# Patient Record
Sex: Female | Born: 1975 | Race: White | Hispanic: No | Marital: Single | Smoking: Never smoker
Health system: Southern US, Community
[De-identification: ages and names within clinical notes are randomized; demographics above are authoritative.]

## PROBLEM LIST (undated history)

## (undated) DIAGNOSIS — F419 Anxiety disorder, unspecified: Secondary | ICD-10-CM

## (undated) DIAGNOSIS — K219 Gastro-esophageal reflux disease without esophagitis: Secondary | ICD-10-CM

## (undated) DIAGNOSIS — J40 Bronchitis, not specified as acute or chronic: Secondary | ICD-10-CM

## (undated) DIAGNOSIS — F32A Depression, unspecified: Secondary | ICD-10-CM

## (undated) DIAGNOSIS — O039 Complete or unspecified spontaneous abortion without complication: Secondary | ICD-10-CM

## (undated) DIAGNOSIS — Z9889 Other specified postprocedural states: Secondary | ICD-10-CM

## (undated) DIAGNOSIS — Z8751 Personal history of pre-term labor: Secondary | ICD-10-CM

## (undated) HISTORY — PX: BREAST SURGERY: SHX581

## (undated) HISTORY — DX: Personal history of pre-term labor: Z87.51

## (undated) HISTORY — PX: BREAST ENHANCEMENT SURGERY: SHX7

## (undated) HISTORY — PX: APPENDECTOMY: SHX54

---

## 2009-07-06 ENCOUNTER — Encounter: Payer: Self-pay | Admitting: Family Medicine

## 2009-07-06 ENCOUNTER — Other Ambulatory Visit: Admission: RE | Admit: 2009-07-06 | Discharge: 2009-07-06 | Payer: Self-pay | Admitting: Family Medicine

## 2009-07-06 ENCOUNTER — Ambulatory Visit: Payer: Self-pay | Admitting: Family Medicine

## 2009-07-06 DIAGNOSIS — G44209 Tension-type headache, unspecified, not intractable: Secondary | ICD-10-CM | POA: Insufficient documentation

## 2009-07-11 ENCOUNTER — Encounter (INDEPENDENT_AMBULATORY_CARE_PROVIDER_SITE_OTHER): Payer: Self-pay | Admitting: *Deleted

## 2009-07-22 ENCOUNTER — Emergency Department: Payer: Self-pay | Admitting: Emergency Medicine

## 2011-06-05 ENCOUNTER — Emergency Department: Payer: Self-pay | Admitting: Emergency Medicine

## 2011-12-18 ENCOUNTER — Observation Stay: Payer: Self-pay

## 2012-01-07 ENCOUNTER — Ambulatory Visit: Payer: Self-pay | Admitting: Obstetrics and Gynecology

## 2012-01-07 LAB — CBC WITH DIFFERENTIAL/PLATELET
Basophil %: 0.1 %
Eosinophil %: 0.5 %
HCT: 40 % (ref 35.0–47.0)
HGB: 13.6 g/dL (ref 12.0–16.0)
Lymphocyte #: 1.5 10*3/uL (ref 1.0–3.6)
MCH: 32.4 pg (ref 26.0–34.0)
Monocyte #: 0.6 10*3/uL (ref 0.0–0.7)
Neutrophil #: 7.8 10*3/uL — ABNORMAL HIGH (ref 1.4–6.5)
Platelet: 187 10*3/uL (ref 150–440)
WBC: 10 10*3/uL (ref 3.6–11.0)

## 2012-01-08 ENCOUNTER — Inpatient Hospital Stay: Payer: Self-pay

## 2013-03-17 ENCOUNTER — Ambulatory Visit: Payer: Self-pay | Admitting: Family Medicine

## 2013-03-26 ENCOUNTER — Emergency Department: Payer: Self-pay | Admitting: Emergency Medicine

## 2014-04-12 ENCOUNTER — Emergency Department: Payer: Self-pay | Admitting: Emergency Medicine

## 2014-06-08 ENCOUNTER — Emergency Department: Payer: Self-pay | Admitting: Emergency Medicine

## 2014-06-08 LAB — COMPREHENSIVE METABOLIC PANEL
ALK PHOS: 82 U/L
ANION GAP: 4 — AB (ref 7–16)
Albumin: 3.6 g/dL (ref 3.4–5.0)
BILIRUBIN TOTAL: 0.5 mg/dL (ref 0.2–1.0)
BUN: 9 mg/dL (ref 7–18)
CHLORIDE: 101 mmol/L (ref 98–107)
CREATININE: 0.93 mg/dL (ref 0.60–1.30)
Calcium, Total: 9 mg/dL (ref 8.5–10.1)
Co2: 31 mmol/L (ref 21–32)
EGFR (African American): >60
EGFR (Non-African Amer.): >60
GLUCOSE: 107 mg/dL — AB (ref 65–99)
Osmolality: 271 (ref 275–301)
Potassium: 3.8 mmol/L (ref 3.5–5.1)
SGOT(AST): 17 U/L (ref 15–37)
SGPT (ALT): 24 U/L (ref 12–78)
SODIUM: 136 mmol/L (ref 136–145)
TOTAL PROTEIN: 7.1 g/dL (ref 6.4–8.2)

## 2014-06-08 LAB — CBC WITH DIFFERENTIAL/PLATELET
BASOS PCT: 0.2 %
Basophil #: 0 10*3/uL (ref 0.0–0.1)
EOS ABS: 0.3 10*3/uL (ref 0.0–0.7)
Eosinophil %: 2.8 %
HCT: 41.4 % (ref 35.0–47.0)
HGB: 14.2 g/dL (ref 12.0–16.0)
LYMPHS PCT: 15 %
Lymphocyte #: 1.6 10*3/uL (ref 1.0–3.6)
MCH: 32.1 pg (ref 26.0–34.0)
MCHC: 34.4 g/dL (ref 32.0–36.0)
MCV: 94 fL (ref 80–100)
MONOS PCT: 4.5 %
Monocyte #: 0.5 x10 3/mm (ref 0.2–0.9)
Neutrophil #: 8 10*3/uL — ABNORMAL HIGH (ref 1.4–6.5)
Neutrophil %: 77.5 %
Platelet: 231 10*3/uL (ref 150–440)
RBC: 4.43 10*6/uL (ref 3.80–5.20)
RDW: 13.2 % (ref 11.5–14.5)
WBC: 10.4 10*3/uL (ref 3.6–11.0)

## 2014-06-08 LAB — URINALYSIS, COMPLETE
BACTERIA: NONE SEEN
Bilirubin,UR: NEGATIVE
Blood: NEGATIVE
GLUCOSE, UR: NEGATIVE mg/dL (ref 0–75)
Ketone: NEGATIVE
LEUKOCYTE ESTERASE: NEGATIVE
NITRITE: NEGATIVE
PH: 7 (ref 4.5–8.0)
Protein: NEGATIVE
RBC,UR: NONE SEEN /HPF (ref 0–5)
SPECIFIC GRAVITY: 1.018 (ref 1.003–1.030)
WBC UR: NONE SEEN /HPF (ref 0–5)

## 2014-06-08 LAB — LIPASE, BLOOD: LIPASE: 93 U/L (ref 73–393)

## 2015-04-17 NOTE — Op Note (Signed)
PATIENT NAME:  Kim Mayo MR#:  [id number] DATE OF BIRTH:  [birth year]  DATE OF PROCEDURE:  01/08/2012  PREOPERATIVE DIAGNOSES:  1. Intrauterine pregnancy at 39 weeks, 0 days gestational age.  2. Breech presentation.  3. History of prior cesarean section, desires repeat.   POSTOPERATIVE DIAGNOSES:  1. Intrauterine pregnancy at 39 weeks, 0 days gestational age.  2. Breech presentation.  3. History of prior cesarean section, desires repeat.   PROCEDURES: Repeat low transverse cesarean section via Pfannenstiel skin incision.   SURGEON: Prentice Docker, M.D.   ASSISTANT: Donzetta Matters, M.D.   ESTIMATED BLOOD LOSS: 500 mL. IV FLUIDS: 2000 mL crystalloid.   URINE OUTPUT: 50 mL clear urine at the end of the case.   SPECIMENS: None.   COMPLICATIONS: None.   FINDINGS:  1. Gravid uterus.  2. Normal appearing fallopian tubes and ovaries bilaterally.  3. Viable female infant with weight of 3,970 grams and Apgars of 8 at one minute and 9 at five minutes.   INDICATIONS FOR PROCEDURE: Kim Mayo is a 39 year old, gravida 3, para 2-0-0-2 who has a history of prior cesarean section. Her fetus is also in the breech presentation and this was verified prior to the procedure. She desired a repeat cesarean section and was therefore taken to the Operating Room for abdominal delivery.   PROCEDURE IN DETAIL: After the patient was met in the preoperative area and consents were reviewed and the patient was examined and questions were answered, she was taken to the Operating Room. She was placed under a spinal anesthesia, which was found to be adequate. She was then placed in the dorsal supine position with a leftward tilt. She was prepped and draped in the usual sterile fashion. A Pfannenstiel incision was made and carried down through the various layers until the abdomen was entered without difficulty. The peritoneum was tented up and entered sharply and extended superiorly and inferiorly with  gentle traction. The bladder blade was placed and a bladder flap was created and the bladder retractor was then replaced retracting the bladder out of the operative field. The uterine position was assessed and then a low transverse incision was made using the scalpel. The incision was extended laterally using cranial and caudal tension. The fetal breech was grasped and delivered in the usual fashion without difficulty. The cord was clamped and cut and the infant was handed to the waiting pediatrician. Cord blood was collected. The placenta was then delivered spontaneously intact and with a three-vessel cord. The uterus was then exteriorized and cleared of all clots and debris. The hysterotomy was closed using a running lock stitch of #0 Vicryl. A second layer of the same suture was used to create an imbrocation layer. Hemostasis was obtained with one additional figure-of-eight stitch along the hysterotomy line. The abdomen was then irrigated and the uterus was returned to the abdomen without difficulty. Hemostasis was assured and the peritoneum was closed using #0 Vicryl in a running fashion.   Two 6-inch x 17-gauge needles were introduced just deep to this fascial layer and superficial to the rectus muscle where the needles were then removed and the sheaths were still positioned.  The needles were introduced through the skin approximately 4cm superior to the skin incision and approximately 1-2cm lateral to the midline. The On-Q silver soaker catheters were inserted through the sheath and the catheters were then placed along the rectus muscles for secure location. The sheaths were then removed from the skin and peeled away. Silver soaker catheters  were secured at the skin with Dermabond, Steri-Strips, and Tegaderm. Once the catheters were secured at the skin, attention was turned to the fascia which was closed using a PDS in a running fashion. The skin was closed using 3-0 Monocryl in a subcuticular fashion and  Steri-Strips were used to secure the skin incision.   The On-Q pain pump catheters were primed with 10 mL of 0.5% Marcaine plain and then the On-Q pain pump was then attached and secured to both catheters. The On-Q pain pump delivers only 2 mL/h per catheter for two catheters for a total of 4 mL/h. There is a total of 400 mL of bupivacaine in the On-Q pain pump.   The patient tolerated the procedure well. Sponge, lap, and needle counts were correct x2. She received prophylactic antibiotics in the form of cefazolin 2 grams prior to skin incision within half an hour. For VTE prophylaxis, the patient had SCDs placed  and were operating prior to skin incision.   ____________________________ Will Bonnet, MD sdj:ap D: 01/08/2012 08:47:07 ET T: 01/08/2012 12:01:45 ET JOB#: [id number]  cc: Will Bonnet, MD, <Dictator> Will Bonnet MD ELECTRONICALLY SIGNED 01/09/2012 23:22

## 2015-08-06 ENCOUNTER — Emergency Department: Payer: BLUE CROSS/BLUE SHIELD

## 2015-08-06 ENCOUNTER — Emergency Department
Admission: EM | Admit: 2015-08-06 | Discharge: 2015-08-07 | Disposition: A | Discharge status: Home / Self Care | Payer: BLUE CROSS/BLUE SHIELD | Source: Home / Self Care | Attending: Student | Admitting: Student

## 2015-08-06 ENCOUNTER — Encounter: Payer: Self-pay | Admitting: *Deleted

## 2015-08-06 DIAGNOSIS — K8000 Calculus of gallbladder with acute cholecystitis without obstruction: Secondary | ICD-10-CM | POA: Diagnosis not present

## 2015-08-06 DIAGNOSIS — R52 Pain, unspecified: Secondary | ICD-10-CM

## 2015-08-06 DIAGNOSIS — Z3202 Encounter for pregnancy test, result negative: Secondary | ICD-10-CM | POA: Insufficient documentation

## 2015-08-06 DIAGNOSIS — K8020 Calculus of gallbladder without cholecystitis without obstruction: Secondary | ICD-10-CM | POA: Insufficient documentation

## 2015-08-06 DIAGNOSIS — Z9049 Acquired absence of other specified parts of digestive tract: Secondary | ICD-10-CM | POA: Diagnosis not present

## 2015-08-06 DIAGNOSIS — K821 Hydrops of gallbladder: Secondary | ICD-10-CM | POA: Diagnosis not present

## 2015-08-06 DIAGNOSIS — R197 Diarrhea, unspecified: Secondary | ICD-10-CM | POA: Insufficient documentation

## 2015-08-06 DIAGNOSIS — Z79899 Other long term (current) drug therapy: Secondary | ICD-10-CM | POA: Diagnosis not present

## 2015-08-06 DIAGNOSIS — F419 Anxiety disorder, unspecified: Secondary | ICD-10-CM | POA: Diagnosis not present

## 2015-08-06 DIAGNOSIS — R1011 Right upper quadrant pain: Secondary | ICD-10-CM

## 2015-08-06 DIAGNOSIS — R1110 Vomiting, unspecified: Secondary | ICD-10-CM

## 2015-08-06 HISTORY — DX: Anxiety disorder, unspecified: F41.9

## 2015-08-06 LAB — COMPREHENSIVE METABOLIC PANEL
ALBUMIN: 3.9 g/dL (ref 3.5–5.0)
ALK PHOS: 61 U/L (ref 38–126)
ALT: 23 U/L (ref 14–54)
AST: 33 U/L (ref 15–41)
Anion gap: 10 (ref 5–15)
BUN: 12 mg/dL (ref 6–20)
CO2: 23 mmol/L (ref 22–32)
CREATININE: 1.04 mg/dL — AB (ref 0.44–1.00)
Calcium: 8.3 mg/dL — ABNORMAL LOW (ref 8.9–10.3)
Chloride: 99 mmol/L — ABNORMAL LOW (ref 101–111)
GFR calc Af Amer: >60 mL/min (ref >60)
GLUCOSE: 121 mg/dL — AB (ref 65–99)
POTASSIUM: 3.3 mmol/L — AB (ref 3.5–5.1)
Sodium: 132 mmol/L — ABNORMAL LOW (ref 135–145)
Total Bilirubin: 0.7 mg/dL (ref 0.3–1.2)
Total Protein: 7.5 g/dL (ref 6.5–8.1)

## 2015-08-06 LAB — CBC
HCT: 43.2 % (ref 35.0–47.0)
HEMOGLOBIN: 15.2 g/dL (ref 12.0–16.0)
MCH: 32.4 pg (ref 26.0–34.0)
MCHC: 35.2 g/dL (ref 32.0–36.0)
MCV: 92.1 fL (ref 80.0–100.0)
PLATELETS: 184 10*3/uL (ref 150–440)
RBC: 4.69 MIL/uL (ref 3.80–5.20)
RDW: 13.1 % (ref 11.5–14.5)
WBC: 8 10*3/uL (ref 3.6–11.0)

## 2015-08-06 LAB — LIPASE, BLOOD: Lipase: 14 U/L — ABNORMAL LOW (ref 22–51)

## 2015-08-06 MED ORDER — SODIUM CHLORIDE 0.9 % IV BOLUS (SEPSIS)
1000.0000 mL | Freq: Once | INTRAVENOUS | Status: AC
  Administered 2015-08-06: 1000 mL via INTRAVENOUS

## 2015-08-06 MED ORDER — ONDANSETRON HCL 4 MG/2ML IJ SOLN
4.0000 mg | Freq: Once | INTRAMUSCULAR | Status: AC
  Administered 2015-08-06: 4 mg via INTRAVENOUS
  Filled 2015-08-06: qty 2

## 2015-08-06 MED ORDER — MORPHINE SULFATE 4 MG/ML IJ SOLN
4.0000 mg | Freq: Once | INTRAMUSCULAR | Status: AC
  Administered 2015-08-06: 4 mg via INTRAVENOUS
  Filled 2015-08-06: qty 1

## 2015-08-06 NOTE — ED Notes (Signed)
Patient reports abd pain with nausea, vomiting, diarrhea and fever since Thursday.

## 2015-08-06 NOTE — ED Provider Notes (Signed)
[facility] Emergency Department Provider Note  ____________________________________________  Time seen: Approximately 11:19 PM  I have reviewed the triage vital signs and the nursing notes.   HISTORY  Chief Complaint Abdominal Pain; Nausea; Emesis; and Fever    HPI Kim Mayo is a 39 y.o. female with no chronic medical problems presents for evaluation of gradual onset constant right upper quadrant and epigastric pain, nonbloody nonbilious emesis, nonbloody diarrhea for the past 2 days. No Modifying factors. Currently her symptoms are severe. She describes the pain as cramping and aching. No dysuria. She has had fever today. No abnormal vaginal bleeding or vaginal discharge. She has a son who recently had a GI illness with vomiting and diarrhea.   Past Medical History  Diagnosis Date  . Anxiety     Patient Active Problem List   Diagnosis Date Noted  . HEADACHE, TENSION 07/06/2009    Past Surgical History  Procedure Laterality Date  . Breast surgery    . Appendectomy      Current Outpatient Rx  Name  Route  Sig  Dispense  Refill  . ALPRAZolam (XANAX) 0.5 MG tablet   Oral   Take 1 tablet by mouth 3 (three) times daily as needed.           Allergies Cephalexin; Codeine; and Oxycodone-acetaminophen  History reviewed. No pertinent family history.  Social History Social History  Substance Use Topics  . Smoking status: Never Smoker   . Smokeless tobacco: None  . Alcohol Use: Yes     Comment: occasionally    Review of Systems Constitutional: + fever, no chills Eyes: No visual changes. ENT: No sore throat. Cardiovascular: Denies chest pain. Respiratory: Denies shortness of breath. Gastrointestinal: + abdominal pain.  + nausea, + vomiting.  + diarrhea.  No constipation. Genitourinary: Negative for dysuria. Musculoskeletal: Negative for back pain. Skin: Negative for rash. Neurological: Negative for headaches, focal weakness or  numbness.  10-point ROS otherwise negative.  ____________________________________________   PHYSICAL EXAM:  VITAL SIGNS: ED Triage Vitals  Enc Vitals Group     BP 08/06/15 2233 122/74 mmHg     Pulse Rate 08/06/15 2233 115     Resp 08/06/15 2233 22     Temp 08/06/15 2233 99.4 F (37.4 C)     Temp Source 08/06/15 2233 Oral     SpO2 08/06/15 2233 95 %     Weight 08/06/15 2233 217 lb (98.431 kg)     Height 08/06/15 2233  (1.651 m)     Head Cir --      Peak Flow --      Pain Score 08/06/15 2233 10     Pain Loc --      Pain Edu? --      Excl. in GC? --     Constitutional: Alert and oriented. Nontoxic appearing and in no acute distress. Eyes: Conjunctivae are normal. PERRL. EOMI. Head: Atraumatic. Nose: No congestion/rhinnorhea. Mouth/Throat: Mucous membranes are dry.  Oropharynx non-erythematous. Neck: No stridor.  Cardiovascular: Alley tachycardic rate, regular rhythm. Grossly normal heart sounds.  Good peripheral circulation. Respiratory: Normal respiratory effort.  No retractions. Lungs CTAB. Gastrointestinal: Normal bowel sounds, soft with moderate tenderness to palpation in the right upper quadrant and the epigastrium. Genitourinary: Deferred Musculoskeletal: No lower extremity tenderness nor edema.  No joint effusions. Neurologic:  Normal speech and language. No gross focal neurologic deficits are appreciated. No gait instability. Skin:  Skin is warm, dry and intact. No rash noted. Psychiatric: Mood  and affect are normal. Speech and behavior are normal.  ____________________________________________   LABS (all labs ordered are listed, but only abnormal results are displayed)  Labs Reviewed  LIPASE, BLOOD - Abnormal; Notable for the following:    Lipase 14 (*)    All other components within normal limits  COMPREHENSIVE METABOLIC PANEL - Abnormal; Notable for the following:    Sodium 132 (*)    Potassium 3.3 (*)    Chloride 99 (*)    Glucose, Bld 121 (*)     Creatinine, Ser 1.04 (*)    Calcium 8.3 (*)    All other components within normal limits  URINALYSIS COMPLETEWITH MICROSCOPIC ([facility] ONLY) - Abnormal; Notable for the following:    Color, Urine YELLOW (*)    APPearance HAZY (*)    Ketones, ur TRACE (*)    Protein, ur 30 (*)    Bacteria, UA RARE (*)    Squamous Epithelial / LPF 0-5 (*)    All other components within normal limits  CBC  POC URINE PREG, ED  POCT PREGNANCY, URINE   ____________________________________________  EKG  none ____________________________________________  RADIOLOGY  RUQ ultrasound IMPRESSION: Shadowing gallstones at the neck of the gallbladder without other signs of cholecystitis.  Normal sonographic appearance of the liver and common bile duct.  ____________________________________________   PROCEDURES  Procedure(s) performed: None  Critical Care performed: No  ____________________________________________   INITIAL IMPRESSION / ASSESSMENT AND PLAN / ED COURSE  Pertinent labs & imaging results that were available during my care of the patient were reviewed by me and considered in my medical decision making (see chart for details).  Kim Mayo is a 39 y.o. female with no chronic medical problems presents for evaluation of gradual onset constant right upper quadrant and epigastric pain, nonbloody nonbilious emesis, nonbloody diarrhea for the past 2 days. On exam, she is nontoxic appearing. She has tenderness to palpation in the epigastrium and the right upper quadrant. She is mildly tachycardic. In screening labs, urinalysis, urine pregnancy as well as right upper quadrant ultrasound to evaluate for any acute gallbladder pathology that her symptoms may be related to viral illness. We'll provide pain control, IV fluids, antiemetics.  ----------------------------------------- 2:09 AM on 08/07/2015 ----------------------------------------- Normal lipase. Labs notable for very mild  hyponatremia, very mild creatinine elevation at 1.04. IV fluids have been given. Urine pregnancy test is negative. Urinalysis is not consistent with infection, normal CBC. Right upper quadrant ultrasound shows gallstones at the gallbladder neck without any other evidence to suggest cholecystitis. Her pain has improved at this time, she is tolerating by mouth intake. Tachycardia resolved. I offered her  oxycodone by mouth before she leaves tonight however she has declined. She reports all pain medications have in the past made her nauseated. I discussed with her that she will take an ODT Zofran 15-20 minutes before taking her pain medications and this should be helpful with her nausea. We discussed immediate return precautions, and need for outpatient surgical follow-up and she is comfortable with the discharge plan.  ____________________________________________   FINAL CLINICAL IMPRESSION(S) / ED DIAGNOSES  Final diagnoses:  Pain  Right upper quadrant pain  Vomiting and diarrhea  Calculus of gallbladder without cholecystitis without obstruction      Gayla Doss, MD 08/07/15 [phone]

## 2015-08-07 ENCOUNTER — Observation Stay: Payer: BLUE CROSS/BLUE SHIELD

## 2015-08-07 ENCOUNTER — Encounter: Payer: Self-pay | Admitting: Emergency Medicine

## 2015-08-07 ENCOUNTER — Observation Stay: Payer: BLUE CROSS/BLUE SHIELD | Admitting: Anesthesiology

## 2015-08-07 ENCOUNTER — Observation Stay
Admission: EM | Admit: 2015-08-07 | Discharge: 2015-08-09 | Disposition: A | Discharge status: Home / Self Care | Payer: BLUE CROSS/BLUE SHIELD | Attending: Surgery | Admitting: Surgery

## 2015-08-07 ENCOUNTER — Encounter
Admission: EM | Disposition: A | Discharge status: Home / Self Care | Payer: Self-pay | Source: Home / Self Care | Attending: Emergency Medicine

## 2015-08-07 DIAGNOSIS — K8000 Calculus of gallbladder with acute cholecystitis without obstruction: Principal | ICD-10-CM | POA: Insufficient documentation

## 2015-08-07 DIAGNOSIS — K8020 Calculus of gallbladder without cholecystitis without obstruction: Secondary | ICD-10-CM | POA: Insufficient documentation

## 2015-08-07 DIAGNOSIS — K8012 Calculus of gallbladder with acute and chronic cholecystitis without obstruction: Secondary | ICD-10-CM

## 2015-08-07 DIAGNOSIS — F419 Anxiety disorder, unspecified: Secondary | ICD-10-CM | POA: Insufficient documentation

## 2015-08-07 DIAGNOSIS — K819 Cholecystitis, unspecified: Secondary | ICD-10-CM | POA: Diagnosis present

## 2015-08-07 DIAGNOSIS — Z9049 Acquired absence of other specified parts of digestive tract: Secondary | ICD-10-CM | POA: Insufficient documentation

## 2015-08-07 DIAGNOSIS — N6482 Hypoplasia of breast: Secondary | ICD-10-CM | POA: Insufficient documentation

## 2015-08-07 DIAGNOSIS — K821 Hydrops of gallbladder: Secondary | ICD-10-CM | POA: Insufficient documentation

## 2015-08-07 DIAGNOSIS — Z79899 Other long term (current) drug therapy: Secondary | ICD-10-CM | POA: Insufficient documentation

## 2015-08-07 HISTORY — PX: CHOLECYSTECTOMY: SHX55

## 2015-08-07 LAB — URINALYSIS COMPLETE WITH MICROSCOPIC (ARMC ONLY)
Bilirubin Urine: NEGATIVE
GLUCOSE, UA: NEGATIVE mg/dL
HGB URINE DIPSTICK: NEGATIVE
LEUKOCYTES UA: NEGATIVE
NITRITE: NEGATIVE
PH: 5 (ref 5.0–8.0)
Protein, ur: 30 mg/dL — AB
Specific Gravity, Urine: 1.023 (ref 1.005–1.030)

## 2015-08-07 LAB — POCT PREGNANCY, URINE: PREG TEST UR: NEGATIVE

## 2015-08-07 SURGERY — LAPAROSCOPIC CHOLECYSTECTOMY WITH INTRAOPERATIVE CHOLANGIOGRAM
Anesthesia: General

## 2015-08-07 MED ORDER — SODIUM CHLORIDE 0.9 % IV SOLN
Freq: Once | INTRAVENOUS | Status: AC
  Administered 2015-08-07: 10:00:00 via INTRAVENOUS

## 2015-08-07 MED ORDER — FENTANYL CITRATE (PF) 100 MCG/2ML IJ SOLN
25.0000 ug | INTRAMUSCULAR | Status: DC | PRN
  Administered 2015-08-07 (×2): 25 ug via INTRAVENOUS

## 2015-08-07 MED ORDER — OXYCODONE HCL 5 MG PO TABS: 5.0000 mg | ORAL_TABLET | ORAL | Status: DC | PRN

## 2015-08-07 MED ORDER — ONDANSETRON HCL 4 MG/2ML IJ SOLN
INTRAMUSCULAR | Status: DC | PRN
  Administered 2015-08-07: 4 mg via INTRAVENOUS

## 2015-08-07 MED ORDER — HYDROMORPHONE HCL 1 MG/ML IJ SOLN
1.0000 mg | INTRAMUSCULAR | Status: DC | PRN
  Administered 2015-08-07 – 2015-08-08 (×6): 1 mg via INTRAVENOUS
  Filled 2015-08-07 (×6): qty 1

## 2015-08-07 MED ORDER — SODIUM CHLORIDE 0.9 % IV SOLN
INTRAVENOUS | Status: DC | PRN
  Administered 2015-08-07: 10 mL

## 2015-08-07 MED ORDER — PROMETHAZINE HCL 25 MG/ML IJ SOLN
INTRAMUSCULAR | Status: AC
  Filled 2015-08-07: qty 1

## 2015-08-07 MED ORDER — PROMETHAZINE HCL 25 MG/ML IJ SOLN
6.2500 mg | INTRAMUSCULAR | Status: DC | PRN
  Administered 2015-08-07: 6.25 mg via INTRAVENOUS

## 2015-08-07 MED ORDER — ACETAMINOPHEN 650 MG RE SUPP
650.0000 mg | Freq: Four times a day (QID) | RECTAL | Status: DC | PRN
Start: 2015-08-07 — End: 2015-08-09

## 2015-08-07 MED ORDER — SODIUM CHLORIDE 0.9 % IV SOLN
INTRAVENOUS | Status: DC | PRN
  Administered 2015-08-07: 1000 mL via INTRAMUSCULAR

## 2015-08-07 MED ORDER — CEFAZOLIN SODIUM-DEXTROSE 2-3 GM-% IV SOLR: 2.0000 g | INTRAVENOUS | Status: DC

## 2015-08-07 MED ORDER — LACTATED RINGERS IV SOLN
INTRAVENOUS | Status: DC | PRN
  Administered 2015-08-07 (×2): via INTRAVENOUS

## 2015-08-07 MED ORDER — NEOSTIGMINE METHYLSULFATE 10 MG/10ML IV SOLN
INTRAVENOUS | Status: DC | PRN
  Administered 2015-08-07: 4 mg via INTRAVENOUS

## 2015-08-07 MED ORDER — ONDANSETRON 4 MG PO TBDP: 4.0000 mg | ORAL_TABLET | Freq: Four times a day (QID) | ORAL | Status: DC | PRN

## 2015-08-07 MED ORDER — SODIUM CHLORIDE 0.9 % IJ SOLN
INTRAMUSCULAR | Status: AC
  Filled 2015-08-07: qty 10

## 2015-08-07 MED ORDER — ONDANSETRON HCL 4 MG/2ML IJ SOLN
4.0000 mg | Freq: Four times a day (QID) | INTRAMUSCULAR | Status: DC | PRN
  Administered 2015-08-07 – 2015-08-09 (×3): 4 mg via INTRAVENOUS
  Filled 2015-08-07 (×3): qty 2

## 2015-08-07 MED ORDER — FAMOTIDINE IN NACL 20-0.9 MG/50ML-% IV SOLN
20.0000 mg | Freq: Once | INTRAVENOUS | Status: AC
  Administered 2015-08-07: 20 mg via INTRAVENOUS
  Filled 2015-08-07: qty 50

## 2015-08-07 MED ORDER — HEPARIN SODIUM (PORCINE) 5000 UNIT/ML IJ SOLN
INTRAMUSCULAR | Status: AC
  Filled 2015-08-07: qty 1

## 2015-08-07 MED ORDER — MORPHINE SULFATE 4 MG/ML IJ SOLN: 4.0000 mg | Freq: Once | INTRAMUSCULAR | Status: DC

## 2015-08-07 MED ORDER — BUPIVACAINE HCL (PF) 0.25 % IJ SOLN
INTRAMUSCULAR | Status: AC
  Filled 2015-08-07: qty 30

## 2015-08-07 MED ORDER — MORPHINE SULFATE 4 MG/ML IJ SOLN
4.0000 mg | Freq: Once | INTRAMUSCULAR | Status: AC
  Administered 2015-08-07: 4 mg via INTRAVENOUS
  Filled 2015-08-07: qty 1

## 2015-08-07 MED ORDER — ENOXAPARIN SODIUM 40 MG/0.4ML ~~LOC~~ SOLN
40.0000 mg | SUBCUTANEOUS | Status: DC
Start: 2015-08-07 — End: 2015-08-09
  Administered 2015-08-07 – 2015-08-09 (×3): 40 mg via SUBCUTANEOUS
  Filled 2015-08-07 (×3): qty 0.4

## 2015-08-07 MED ORDER — KCL IN DEXTROSE-NACL 40-5-0.45 MEQ/L-%-% IV SOLN
INTRAVENOUS | Status: DC
  Administered 2015-08-07 – 2015-08-09 (×4): via INTRAVENOUS
  Filled 2015-08-07 (×6): qty 1000

## 2015-08-07 MED ORDER — ROCURONIUM BROMIDE 100 MG/10ML IV SOLN
INTRAVENOUS | Status: DC | PRN
  Administered 2015-08-07: 15 mg via INTRAVENOUS
  Administered 2015-08-07: 10 mg via INTRAVENOUS
  Administered 2015-08-07: 5 mg via INTRAVENOUS

## 2015-08-07 MED ORDER — KETOROLAC TROMETHAMINE 30 MG/ML IJ SOLN
INTRAMUSCULAR | Status: DC | PRN
  Administered 2015-08-07: 30 mg via INTRAVENOUS

## 2015-08-07 MED ORDER — FENTANYL CITRATE (PF) 100 MCG/2ML IJ SOLN
INTRAMUSCULAR | Status: AC
  Filled 2015-08-07: qty 2

## 2015-08-07 MED ORDER — BUPIVACAINE HCL (PF) 0.25 % IJ SOLN
INTRAMUSCULAR | Status: DC | PRN
  Administered 2015-08-07: 30 mL

## 2015-08-07 MED ORDER — ONDANSETRON HCL 4 MG/2ML IJ SOLN
4.0000 mg | Freq: Once | INTRAMUSCULAR | Status: AC
  Administered 2015-08-07: 4 mg via INTRAVENOUS
  Filled 2015-08-07: qty 2

## 2015-08-07 MED ORDER — OXYCODONE HCL 5 MG PO TABS: 5.0000 mg | ORAL_TABLET | Freq: Four times a day (QID) | ORAL | Status: DC | PRN

## 2015-08-07 MED ORDER — ACETAMINOPHEN 325 MG PO TABS: 650.0000 mg | ORAL_TABLET | Freq: Four times a day (QID) | ORAL | Status: DC | PRN

## 2015-08-07 MED ORDER — LIDOCAINE HCL (CARDIAC) 20 MG/ML IV SOLN
INTRAVENOUS | Status: DC | PRN
  Administered 2015-08-07: 50 mg via INTRAVENOUS

## 2015-08-07 MED ORDER — MIDAZOLAM HCL 2 MG/2ML IJ SOLN
INTRAMUSCULAR | Status: DC | PRN
  Administered 2015-08-07: 3 mg via INTRAVENOUS

## 2015-08-07 MED ORDER — GLYCOPYRROLATE 0.2 MG/ML IJ SOLN
INTRAMUSCULAR | Status: DC | PRN
  Administered 2015-08-07: 0.4 mg via INTRAVENOUS

## 2015-08-07 MED ORDER — ALPRAZOLAM 0.5 MG PO TABS: 0.5000 mg | ORAL_TABLET | Freq: Three times a day (TID) | ORAL | Status: DC | PRN

## 2015-08-07 MED ORDER — FENTANYL CITRATE (PF) 100 MCG/2ML IJ SOLN
INTRAMUSCULAR | Status: DC | PRN
  Administered 2015-08-07 (×2): 100 ug via INTRAVENOUS
  Administered 2015-08-07: 50 ug via INTRAVENOUS

## 2015-08-07 MED ORDER — DEXAMETHASONE SODIUM PHOSPHATE 4 MG/ML IJ SOLN
INTRAMUSCULAR | Status: DC | PRN
  Administered 2015-08-07: 10 mg via INTRAVENOUS

## 2015-08-07 MED ORDER — SUCCINYLCHOLINE CHLORIDE 20 MG/ML IJ SOLN
INTRAMUSCULAR | Status: DC | PRN
  Administered 2015-08-07: 100 mg via INTRAVENOUS

## 2015-08-07 MED ORDER — ONDANSETRON 4 MG PO TBDP: 4.0000 mg | ORAL_TABLET | Freq: Three times a day (TID) | ORAL | Status: DC | PRN

## 2015-08-07 MED ORDER — PROPOFOL 10 MG/ML IV BOLUS
INTRAVENOUS | Status: DC | PRN
  Administered 2015-08-07: 150 mg via INTRAVENOUS

## 2015-08-07 SURGICAL SUPPLY — 45 items
APPLIER CLIP ROT 10 11.4 M/L (STAPLE) ×3
APR CLP MED LRG 11.4X10 (STAPLE) ×1
BAG COUNTER SPONGE EZ (MISCELLANEOUS) ×2 IMPLANT
BAG SPNG 4X4 CLR HAZ (MISCELLANEOUS) ×1
CANISTER SUCT 1200ML W/VALVE (MISCELLANEOUS) ×3 IMPLANT
CATH REDDICK CHOLANGI 4FR 50CM (CATHETERS) ×3 IMPLANT
CHLORAPREP W/TINT 26ML (MISCELLANEOUS) ×3 IMPLANT
CLIP APPLIE ROT 10 11.4 M/L (STAPLE) ×1 IMPLANT
CONRAY 60ML FOR OR (MISCELLANEOUS) ×3 IMPLANT
COUNTER SPONGE BAG EZ (MISCELLANEOUS) ×1
DRAPE SHEET LG 3/4 BI-LAMINATE (DRAPES) ×3 IMPLANT
DRESSING TELFA 4X3 1S ST N-ADH (GAUZE/BANDAGES/DRESSINGS) ×2 IMPLANT
DRSG TEGADERM 2-3/8X2-3/4 SM (GAUZE/BANDAGES/DRESSINGS) ×12 IMPLANT
DRSG TEGADERM 2X2.25 PEDS (GAUZE/BANDAGES/DRESSINGS) ×2 IMPLANT
DRSG TELFA 3X8 NADH (GAUZE/BANDAGES/DRESSINGS) ×3 IMPLANT
GLOVE BIO SURGEON STRL SZ7.5 (GLOVE) ×7 IMPLANT
GLOVE INDICATOR 8.0 STRL GRN (GLOVE) ×3 IMPLANT
GOWN STRL REUS W/ TWL LRG LVL3 (GOWN DISPOSABLE) ×2 IMPLANT
GOWN STRL REUS W/TWL LRG LVL3 (GOWN DISPOSABLE) ×6
GRASPER SUT TROCAR 14GX15 (MISCELLANEOUS) ×1 IMPLANT
IRRIGATION STRYKERFLOW (MISCELLANEOUS) ×1 IMPLANT
IRRIGATOR STRYKERFLOW (MISCELLANEOUS) ×3
IV NS 1000ML (IV SOLUTION) ×3
IV NS 1000ML BAXH (IV SOLUTION) ×1 IMPLANT
LABEL OR SOLS (LABEL) ×3 IMPLANT
NDL INSUFFLATION 14GA 120MM (NEEDLE) ×1 IMPLANT
NDL SAFETY 18GX1.5 (NEEDLE) ×3 IMPLANT
NDL SAFETY 25GX1.5 (NEEDLE) ×3 IMPLANT
NEEDLE INSUFFLATION 14GA 120MM (NEEDLE) ×3 IMPLANT
NS IRRIG 500ML POUR BTL (IV SOLUTION) ×3 IMPLANT
PACK LAP CHOLECYSTECTOMY (MISCELLANEOUS) ×3 IMPLANT
PAD DRESSING TELFA 3X8 NADH (GAUZE/BANDAGES/DRESSINGS) ×1 IMPLANT
PAD GROUND ADULT SPLIT (MISCELLANEOUS) ×3 IMPLANT
POUCH ENDO CATCH 10MM SPEC (MISCELLANEOUS) ×3 IMPLANT
SCISSORS METZENBAUM CVD 33 (INSTRUMENTS) ×3 IMPLANT
SEAL FOR SCOPE WARMER C3101 (MISCELLANEOUS) ×1 IMPLANT
SLEEVE ADV FIXATION 5X100MM (TROCAR) ×3 IMPLANT
SUT ETHILON 5-0 FS-2 18 BLK (SUTURE) ×3 IMPLANT
SUT VIC AB 0 CT2 27 (SUTURE) ×3 IMPLANT
SYR 3ML LL SCALE MARK (SYRINGE) ×3 IMPLANT
TROCAR Z-THREAD FIOS 11X100 BL (TROCAR) ×3 IMPLANT
TROCAR Z-THREAD OPTICAL 5X100M (TROCAR) ×3 IMPLANT
TROCAR Z-THREAD SLEEVE 11X100 (TROCAR) ×3 IMPLANT
TUBING INSUFFLATOR HI FLOW (MISCELLANEOUS) ×3 IMPLANT
WATER STERILE IRR 1000ML POUR (IV SOLUTION) ×1 IMPLANT

## 2015-08-07 NOTE — Anesthesia Preprocedure Evaluation (Signed)
Anesthesia Evaluation  Patient identified by MRN, date of birth, ID band Patient awake    Reviewed: Allergy & Precautions, H&P , NPO status , Patient's Chart, lab work & pertinent test results, reviewed documented beta blocker date and time   History of Anesthesia Complications Negative for: history of anesthetic complications  Airway Mallampati: I  TM Distance: >3 FB Neck ROM: full    Dental no notable dental hx. (+) Teeth Intact   Pulmonary neg pulmonary ROS,  breath sounds clear to auscultation  Pulmonary exam normal       Cardiovascular Exercise Tolerance: Good negative cardio ROS Normal cardiovascular examRhythm:regular Rate:Normal     Neuro/Psych PSYCHIATRIC DISORDERS (Anxiety) negative neurological ROS     GI/Hepatic negative GI ROS, Neg liver ROS,   Endo/Other  negative endocrine ROS  Renal/GU negative Renal ROS  negative genitourinary   Musculoskeletal   Abdominal   Peds  Hematology negative hematology ROS (+)   Anesthesia Other Findings Past Medical History:   Anxiety                                                      Reproductive/Obstetrics negative OB ROS                             Anesthesia Physical Anesthesia Plan  ASA: I  Anesthesia Plan: General   Post-op Pain Management:    Induction:   Airway Management Planned:   Additional Equipment:   Intra-op Plan:   Post-operative Plan:   Informed Consent: I have reviewed the patients History and Physical, chart, labs and discussed the procedure including the risks, benefits and alternatives for the proposed anesthesia with the patient or authorized representative who has indicated his/her understanding and acceptance.   Dental Advisory Given  Plan Discussed with: Anesthesiologist, CRNA and Surgeon  Anesthesia Plan Comments:         Anesthesia Quick Evaluation

## 2015-08-07 NOTE — ED Notes (Signed)
Pt states she was discharged at 0400 this am Diagnosed with Gallstones. Unable to get prescription for pain medications filled. Pt crying due to pain. Was told to return if the pain was unbearable. Tried some of her fathers Dilaudid with no relief.

## 2015-08-07 NOTE — ED Provider Notes (Signed)
[facility] Emergency Department Provider Note     Time seen: ----------------------------------------- 9:46 AM on 08/07/2015 -----------------------------------------    I have reviewed the triage vital signs and the nursing notes.   HISTORY  Chief Complaint Abdominal Pain    HPI Kim Mayo is a 39 y.o. female who presents to ER after being discharged at 4 AM this morning with a diagnosis of gallstones. Patient was unable to get prescriptions for pain medicines filled. Patient states soon as the morphine were wore off she had severe stabbing sharp right upper and epigastric pain. She brought in crying due to the pain was, was told to return pain was unbearable.   Past Medical History  Diagnosis Date  . Anxiety     Patient Active Problem List   Diagnosis Date Noted  . HEADACHE, TENSION 07/06/2009    Past Surgical History  Procedure Laterality Date  . Breast surgery    . Appendectomy      Allergies Cephalexin; Codeine; and Oxycodone-acetaminophen  Social History Social History  Substance Use Topics  . Smoking status: Never Smoker   . Smokeless tobacco: None  . Alcohol Use: Yes     Comment: occasionally    Review of Systems Constitutional: Negative for fever. Eyes: Negative for visual changes. ENT: Negative for sore throat. Cardiovascular: Negative for chest pain. Respiratory: Negative for shortness of breath. Gastrointestinal: Positive for abdominal pain and vomiting Genitourinary: Negative for dysuria. Musculoskeletal: Negative for back pain. Skin: Negative for rash. Neurological: Negative for headaches, focal weakness or numbness.  10-point ROS otherwise negative.  ____________________________________________   PHYSICAL EXAM:  VITAL SIGNS: ED Triage Vitals  Enc Vitals Group     BP 08/07/15 0938 128/89 mmHg     Pulse Rate 08/07/15 0938 101     Resp 08/07/15 0938 18     Temp 08/07/15 0938 98.6 F (37 C)   Temp Source 08/07/15 0938 Oral     SpO2 08/07/15 0938 98 %     Weight 08/07/15 0938 217 lb (98.431 kg)     Height 08/07/15 0938  (1.651 m)     Head Cir --      Peak Flow --      Pain Score 08/07/15 0940 10     Pain Loc --      Pain Edu? --      Excl. in GC? --     Constitutional: Alert and oriented. Mild to moderate distress. Eyes: Conjunctivae are normal. PERRL. Normal extraocular movements. ENT   Head: Normocephalic and atraumatic.   Nose: No congestion/rhinnorhea.   Mouth/Throat: Mucous membranes are moist.   Neck: No stridor. Cardiovascular: Normal rate, regular rhythm. Normal and symmetric distal pulses are present in all extremities. No murmurs, rubs, or gallops. Respiratory: Normal respiratory effort without tachypnea nor retractions. Breath sounds are clear and equal bilaterally. No wheezes/rales/rhonchi. Gastrointestinal: Right upper quadrant and epigastric tenderness. No rebound or guarding. Normal bowel sounds Musculoskeletal: Nontender with normal range of motion in all extremities. No joint effusions.  No lower extremity tenderness nor edema. Neurologic:  Normal speech and language. No gross focal neurologic deficits are appreciated. Speech is normal. No gait instability. Skin:  Skin is warm, dry and intact. No rash noted. Psychiatric: Mood and affect are normal. Speech and behavior are normal. Patient exhibits appropriate insight and judgment.  ____________________________________________  ED COURSE:  Pertinent labs & imaging results that were available during my care of the patient were reviewed by me and considered in my  medical decision making (see chart for details). Patient with significant pain, or see her IV morphine and Zofran. Patient likely will be discussed with general surgery for observation ____________________________________________    FINAL ASSESSMENT AND PLAN  Biliary colic  Plan: Patient with labs and imaging as dictated  above. She was severe worsening pain, surgery has been consult. She'll be observed for possible cholecystectomy. I've given her morphine as well as IV Pepcid.   Emily Filbert, MD   Emily Filbert, MD 08/07/15 1001

## 2015-08-07 NOTE — Op Note (Signed)
08/07/2015  1:35 PM  PATIENT:  Kim Mayo  39 y.o. female  PRE-OPERATIVE DIAGNOSIS:  biliary colic, or possible common duct obstruction  POST-OPERATIVE DIAGNOSIS:  biliary colic  PROCEDURE:  Procedure(s): LAPAROSCOPIC CHOLECYSTECTOMY WITH INTRAOPERATIVE CHOLANGIOGRAM (N/A)  SURGEON:  Surgeon(s) and Role:    * Tiney Rouge III, MD - Primary   ASSISTANTS: none   ANESTHESIA:   general  EBL:  Total I/O In: 1000 [I.V.:1000] Out: -    DRAINS: none   LOCAL MEDICATIONS USED:  MARCAINE      DISPOSITION OF SPECIMEN:  PATHOLOGY   DICTATION: .Dragon Dictation with the patient supine position and after induction of appropriate general anesthesia the patient's abdomen was prepped ChloraPrep and draped sterile towels. The patient was placed in headdown feet up position and a small infraumbilical incision was made in the standard fashion. A varies needle was used to cannulate peritoneal cavity. CO2 was insufflated to appropriate pressure measurements. When approximately 2 L of CO2 were instilled a varies needle was withdrawn and an 11 mm trocar inserted without difficulty. Intraperitoneal position was confirmed and CO2 was reinsufflated.  The patient was then placed in Head up feet down position and rotated slightly to the left side. Subxiphoid transverse incision was made and an 11 mm port inserted under direct vision. 2 lateral ports 5 mm in size were inserted under direct vision. Gallbladder was quite tense distended and edematous and discolored. The gallbladder was aspirated approximately 30 cc of dark colored bile. The gallbladder was retracted superiorly and laterally exposing the hepatoduodenal ligament. Cystic artery and cystic duct were identified. Cystic duct clipped on the gallbladder side and opened. An on table cholangiogram using dynamic fluoroscopy revealed free flow of dye into the duodenum without any evidence of obstruction intrahepatic radicals were seen. Catheter was  withdrawn and the cystic duct doubly clipped on the common duct side and divided.  Cystic artery was doubly clipped and divided. The gallbladder was then dissected free from its bed in the liver's hook and cautery apparatus. Once the gallbladder was free it was collected in an Endo Catch apparatus removed through the subxiphoid incision. It was then copiously irrigated with warm saline solution. The abdomen was desufflated. Skin incisions were closed with 5-0 nylon  PLAN OF CARE: Admit for overnight observation  PATIENT DISPOSITION:  PACU - hemodynamically stable.   Tiney Rouge III, MD

## 2015-08-07 NOTE — H&P (Signed)
Kim Mayo is a 39 y.o. female  with several day history of abdominal pain.  HPI: She was seen in the emergency room last evening with midepigastric right upper quadrant back pain. Workup suggested impacted cystic duct stone without evidence of cholecystitis. She had no significant elevation of liver function studies or her white blood cell count. Her pain improved and she discharged home. She will sleep after returning home and woke up this morning with continued pain. Should return to the emergency for further evaluation.  She notes that she's been having abdominal discomfort at this time for several days. This is her first episode of biliary tract disease. She does not have any previous history. Specifically, she denies any history of hepatitis, yellow jaundice, pancreatitis, peptic ulcer disease. She's had previous appendectomy and 2 C-sections. She had a breast augmentation and then a mammary pexied last May. She does not have any other major medical problems.  Past Medical History  Diagnosis Date  . Anxiety    Past Surgical History  Procedure Laterality Date  . Breast surgery    . Appendectomy     Social History   Social History  . Marital Status: Single    Spouse Name: N/A  . Number of Children: N/A  . Years of Education: N/A   Social History Main Topics  . Smoking status: Never Smoker   . Smokeless tobacco: None  . Alcohol Use: Yes     Comment: occasionally  . Drug Use: No  . Sexual Activity: Yes   Other Topics Concern  . None   Social History Narrative     Review of Systems  Constitutional: Positive for malaise/fatigue and diaphoresis. Negative for fever and chills.  HENT: Negative.   Eyes: Negative.   Respiratory: Negative.   Cardiovascular: Negative.   Gastrointestinal: Positive for nausea, vomiting and abdominal pain.  Genitourinary: Negative.   Musculoskeletal: Negative.   Skin: Negative for itching and rash.  Neurological: Negative.    Endo/Heme/Allergies: Negative.   Psychiatric/Behavioral: Negative.      PHYSICAL EXAM: BP 128/89 mmHg  Pulse 101  Temp(Src) 98.6 F (37 C) (Oral)  Resp 18  Ht  (1.651 m)  Wt 217 lb (98.431 kg)  BMI 36.11 kg/m2  SpO2 98%  Physical Exam  Constitutional: She is oriented to person, place, and time. She appears well-developed and well-nourished. She appears distressed.  HENT:  Head: Normocephalic and atraumatic.  Eyes: EOM are normal. Pupils are equal, round, and reactive to light.  Neck: Normal range of motion. Neck supple.  Cardiovascular: Regular rhythm and normal heart sounds.   Pulmonary/Chest: Effort normal and breath sounds normal.  Abdominal: Soft. Bowel sounds are normal. There is tenderness.  Musculoskeletal: Normal range of motion. She exhibits no edema.  Neurological: She is alert and oriented to person, place, and time.  Skin: Skin is warm and dry.  Psychiatric: Her behavior is normal. Judgment normal.   She has no significant abdominal tenderness but is clearly markedly uncomfortable strong suggesting biliary colic or even possibly common duct obstruction.  Impression/Plan: We have discussed surgical intervention today we plan laparoscopic cholecystectomy possible cholangiography. Risks benefits and options been outlined to the patient in detail. She is in agreement with the planned procedure.   Tiney Rouge III, MD  08/07/2015, 10:51 AM

## 2015-08-07 NOTE — Anesthesia Procedure Notes (Signed)
Procedure Name: Intubation Date/Time: 08/07/2015 12:36 PM Performed by: Clovis Fredrickson Pre-anesthesia Checklist: Patient identified, Emergency Drugs available, Suction available, Patient being monitored and Timeout performed Patient Re-evaluated:Patient Re-evaluated prior to inductionOxygen Delivery Method: Circle system utilized Preoxygenation: Pre-oxygenation with 100% oxygen Intubation Type: IV induction Ventilation: Mask ventilation without difficulty Laryngoscope Size: Mac and 4 Grade View: Grade II Tube type: Oral Tube size: 7.0 mm Number of attempts: 1 Airway Equipment and Method: Stylet Placement Confirmation: ETT inserted through vocal cords under direct vision,  positive ETCO2,  CO2 detector and breath sounds checked- equal and bilateral Secured at: 22 cm Tube secured with: Tape Dental Injury: Teeth and Oropharynx as per pre-operative assessment

## 2015-08-07 NOTE — Transfer of Care (Signed)
Immediate Anesthesia Transfer of Care Note  Patient: Kim Mayo  Procedure(s) Performed: Procedure(s): LAPAROSCOPIC CHOLECYSTECTOMY WITH INTRAOPERATIVE CHOLANGIOGRAM (N/A)  Patient Location: PACU  Anesthesia Type:General  Level of Consciousness: awake and alert   Airway & Oxygen Therapy: Patient Spontanous Breathing and Patient connected to face mask oxygen  Post-op Assessment: Report given to RN  Post vital signs: Reviewed and stable  Last Vitals:  Filed Vitals:   08/07/15 0938  BP: 128/89  Pulse: 101  Temp: 37 C  Resp: 18    Complications: No apparent anesthesia complications

## 2015-08-08 ENCOUNTER — Encounter: Payer: Self-pay | Admitting: Surgery

## 2015-08-08 LAB — COMPREHENSIVE METABOLIC PANEL
ALT: 55 U/L — AB (ref 14–54)
AST: 47 U/L — AB (ref 15–41)
Albumin: 3 g/dL — ABNORMAL LOW (ref 3.5–5.0)
Alkaline Phosphatase: 43 U/L (ref 38–126)
Anion gap: 4 — ABNORMAL LOW (ref 5–15)
BUN: 10 mg/dL (ref 6–20)
CHLORIDE: 106 mmol/L (ref 101–111)
CO2: 25 mmol/L (ref 22–32)
CREATININE: 0.78 mg/dL (ref 0.44–1.00)
Calcium: 7.4 mg/dL — ABNORMAL LOW (ref 8.9–10.3)
GFR calc non Af Amer: >60 mL/min (ref >60)
Glucose, Bld: 132 mg/dL — ABNORMAL HIGH (ref 65–99)
POTASSIUM: 4.1 mmol/L (ref 3.5–5.1)
SODIUM: 135 mmol/L (ref 135–145)
Total Bilirubin: 0.6 mg/dL (ref 0.3–1.2)
Total Protein: 6 g/dL — ABNORMAL LOW (ref 6.5–8.1)

## 2015-08-08 LAB — CBC
HEMATOCRIT: 36.8 % (ref 35.0–47.0)
Hemoglobin: 12.8 g/dL (ref 12.0–16.0)
MCH: 32.2 pg (ref 26.0–34.0)
MCHC: 34.7 g/dL (ref 32.0–36.0)
MCV: 92.8 fL (ref 80.0–100.0)
PLATELETS: 159 10*3/uL (ref 150–440)
RBC: 3.97 MIL/uL (ref 3.80–5.20)
RDW: 13.4 % (ref 11.5–14.5)
WBC: 4.1 10*3/uL (ref 3.6–11.0)

## 2015-08-08 MED ORDER — HYDROMORPHONE HCL 2 MG PO TABS
2.0000 mg | ORAL_TABLET | ORAL | Status: DC | PRN
  Administered 2015-08-08 – 2015-08-09 (×5): 2 mg via ORAL
  Filled 2015-08-08 (×5): qty 1

## 2015-08-08 MED ORDER — SIMETHICONE 80 MG PO CHEW
80.0000 mg | CHEWABLE_TABLET | Freq: Four times a day (QID) | ORAL | Status: DC | PRN
  Administered 2015-08-08: 80 mg via ORAL
  Filled 2015-08-08: qty 1

## 2015-08-08 MED ORDER — DIAZEPAM 5 MG PO TABS
5.0000 mg | ORAL_TABLET | Freq: Four times a day (QID) | ORAL | Status: DC | PRN
  Administered 2015-08-08 (×2): 5 mg via ORAL
  Filled 2015-08-08 (×2): qty 1

## 2015-08-08 NOTE — Progress Notes (Signed)
1 Day Post-Op   Subjective: 39 year old female s/p lap chole with IOC. Has not taken any PO medications since surgery adn continues to complain of pain. Otherwise, states she is better than before surgery but not sure if she is ready to go home. Complains of muscle spasms at incision sites.   Vital signs in last 24 hours: Temp:  [97.1 F (36.2 C)-98.3 F (36.8 C)] 97.9 F (36.6 C) (08/15 0740) Pulse Rate:  [47-116] 64 (08/15 0740) Resp:  [15-18] 17 (08/15 0740) BP: (81-127)/(48-92) 90/60 mmHg (08/15 0740) SpO2:  [90 %-98 %] 98 % (08/15 0740)    Intake/Output from previous day: 08/14 0701 - 08/15 0700 In: 2095 [I.V.:2095] Out: 200 [Urine:200]  GI: abnormal findings:  obese and tenderness to palpation around incisions. Otherwise abdomen is soft and nondistended with normal bowel sounds and no evidence of infection or peritonitis.l  Lab Results:  CBC  Recent Labs  08/06/15 2301 08/08/15 0445  WBC 8.0 4.1  HGB 15.2 12.8  HCT 43.2 36.8  PLT 184 159   CMP     Component Value Date/Time   NA 135 08/08/2015 0445   NA 136 06/08/2014 0928   K 4.1 08/08/2015 0445   K 3.8 06/08/2014 0928   CL 106 08/08/2015 0445   CL 101 06/08/2014 0928   CO2 25 08/08/2015 0445   CO2 31 06/08/2014 0928   GLUCOSE 132* 08/08/2015 0445   GLUCOSE 107* 06/08/2014 0928   BUN 10 08/08/2015 0445   BUN 9 06/08/2014 0928   CREATININE 0.78 08/08/2015 0445   CREATININE 0.93 06/08/2014 0928   CALCIUM 7.4* 08/08/2015 0445   CALCIUM 9.0 06/08/2014 0928   PROT 6.0* 08/08/2015 0445   PROT 7.1 06/08/2014 0928   ALBUMIN 3.0* 08/08/2015 0445   ALBUMIN 3.6 06/08/2014 0928   AST 47* 08/08/2015 0445   AST 17 06/08/2014 0928   ALT 55* 08/08/2015 0445   ALT 24 06/08/2014 0928   ALKPHOS 43 08/08/2015 0445   ALKPHOS 82 06/08/2014 0928   BILITOT 0.6 08/08/2015 0445   BILITOT 0.5 06/08/2014 0928   GFRNONAA >60 08/08/2015 0445   GFRNONAA >60 06/08/2014 0928   GFRAA >60 08/08/2015 0445   GFRAA >60  06/08/2014 0928   PT/INR No results for input(s): LABPROT, INR in the last 72 hours.  Studies/Results: Dg Cholangiogram Operative  08/07/2015   CLINICAL DATA:  Cholelithiasis.  EXAM: INTRAOPERATIVE CHOLANGIOGRAM  TECHNIQUE: Cholangiographic images from the C-arm fluoroscopic device were submitted for interpretation post-operatively. Please see the procedural report for the amount of contrast and the fluoroscopy time utilized.  COMPARISON:  Ultrasound of same day.  FLUOROSCOPY TIME:  16 seconds.  FINDINGS: Four sequences were obtained intraoperative lead these demonstrate contrast being injected into cannulated cystic duct remnant. No definite common bile duct or intrahepatic biliary dilatation is noted. No definite filling defects are noted. Antegrade flow into duodenum is noted.  IMPRESSION: No definite evidence of stones is seen in the common bile duct.   Electronically Signed   By: Lupita Raider, M.D.   On: 08/07/2015 13:28   US Abdomen Limited Ruq  08/07/2015   CLINICAL DATA:  Right upper quadrant abdominal pain. Nausea and vomiting. Diarrhea. Symptoms for 3 days.  EXAM: US ABDOMEN LIMITED - RIGHT UPPER QUADRANT  COMPARISON:  Right upper quadrant ultrasound 06/08/2014.  FINDINGS: Gallbladder:  Shadowing gallstones are present near the neck of the gallbladder. These are nonmobile. There is some gallbladder sludge is well. Gallbladder wall thickness is  upper limits of normal 2.7 mm. There is no sonographic Murphy sign.  Common bile duct:  Diameter: 3.6 mm, within normal limits  Liver:  No focal lesion identified. Within normal limits in parenchymal echogenicity.  IMPRESSION: Shadowing gallstones at the neck of the gallbladder without other signs of cholecystitis.  Normal sonographic appearance of the liver and common bile duct.   Electronically Signed   By: Marin Roberts M.D.   On: 08/07/2015 01:09    Assessment/Plan: 39 year old female POD#1 s/p lap chole with IOC Encourage PO and advance  diet Transition to PO Pain medication Will add PO Valium for abdominal wall spasm  Ricarda Frame, MD [facility] General Surgeon [facility] 08/08/2015 10:43

## 2015-08-08 NOTE — Progress Notes (Signed)
Called and spoke with Dr.Bird about patient request for something for gas. Dr.Bird verbalized that he would put in an order for simethicone tablet. Kim Mayo

## 2015-08-09 DIAGNOSIS — K819 Cholecystitis, unspecified: Secondary | ICD-10-CM

## 2015-08-09 LAB — SURGICAL PATHOLOGY

## 2015-08-09 MED ORDER — DIAZEPAM 5 MG PO TABS: 5.0000 mg | ORAL_TABLET | Freq: Four times a day (QID) | ORAL | Status: DC | PRN

## 2015-08-09 MED ORDER — HYDROMORPHONE HCL 2 MG PO TABS: 2.0000 mg | ORAL_TABLET | ORAL | Status: DC | PRN

## 2015-08-09 MED ORDER — ONDANSETRON 4 MG PO TBDP: 4.0000 mg | ORAL_TABLET | Freq: Three times a day (TID) | ORAL | Status: DC | PRN

## 2015-08-09 NOTE — Progress Notes (Signed)
Pt VSS; Given zofran once; Pt received discharge orders. Instructions were reviewed with pt with all questions answered.  IV removed with dressing dry and intact; Scripts given to pt. Pt discharged via wheelchair.

## 2015-08-09 NOTE — Progress Notes (Signed)
Patient nauseated early this morning , she had 1 episode of large amount of brown colored emesis. Zofran given iv. Patient also requested pain medication and dilaudid po was given. Patient was also assisted with a bath this morning. Anselm Jungling

## 2015-08-09 NOTE — Progress Notes (Signed)
2 Days Post-Op   Subjective: 39 year old female 2 days status post lap scopic cholestatic. Patient states that she did have a single episode of emesis overnight. However since that time she reports that her pain is much improved, she no longer has any nausea, and that she desires to increase her by mouth intake. She's not had any IV medications since last night. She is ambulating and passing flatus.  Vital signs in last 24 hours: Temp:  [98.2 F (36.8 C)-99.3 F (37.4 C)] 99.3 F (37.4 C) (08/16 0810) Pulse Rate:  [78-104] 90 (08/16 0810) Resp:  [16-18] 16 (08/16 0810) BP: (91-111)/(43-68) 91/43 mmHg (08/16 0810) SpO2:  [94 %-100 %] 94 % (08/16 0810) Last BM Date: 08/09/15  Intake/Output from previous day: 08/15 0701 - 08/16 0700 In: 2524 [P.O.:960; I.V.:1564] Out: 450 [Urine:450]  GI: soft, non-tender; bowel sounds normal; no masses,  no organomegaly. Incision sites are well approximated and covered sterile dressings.  Lab Results:  CBC  Recent Labs  08/06/15 2301 08/08/15 0445  WBC 8.0 4.1  HGB 15.2 12.8  HCT 43.2 36.8  PLT 184 159   CMP     Component Value Date/Time   NA 135 08/08/2015 0445   NA 136 06/08/2014 0928   K 4.1 08/08/2015 0445   K 3.8 06/08/2014 0928   CL 106 08/08/2015 0445   CL 101 06/08/2014 0928   CO2 25 08/08/2015 0445   CO2 31 06/08/2014 0928   GLUCOSE 132* 08/08/2015 0445   GLUCOSE 107* 06/08/2014 0928   BUN 10 08/08/2015 0445   BUN 9 06/08/2014 0928   CREATININE 0.78 08/08/2015 0445   CREATININE 0.93 06/08/2014 0928   CALCIUM 7.4* 08/08/2015 0445   CALCIUM 9.0 06/08/2014 0928   PROT 6.0* 08/08/2015 0445   PROT 7.1 06/08/2014 0928   ALBUMIN 3.0* 08/08/2015 0445   ALBUMIN 3.6 06/08/2014 0928   AST 47* 08/08/2015 0445   AST 17 06/08/2014 0928   ALT 55* 08/08/2015 0445   ALT 24 06/08/2014 0928   ALKPHOS 43 08/08/2015 0445   ALKPHOS 82 06/08/2014 0928   BILITOT 0.6 08/08/2015 0445   BILITOT 0.5 06/08/2014 0928   GFRNONAA >60  08/08/2015 0445   GFRNONAA >60 06/08/2014 0928   GFRAA >60 08/08/2015 0445   GFRAA >60 06/08/2014 0928   PT/INR No results for input(s): LABPROT, INR in the last 72 hours.  Studies/Results: Dg Cholangiogram Operative  08/07/2015   CLINICAL DATA:  Cholelithiasis.  EXAM: INTRAOPERATIVE CHOLANGIOGRAM  TECHNIQUE: Cholangiographic images from the C-arm fluoroscopic device were submitted for interpretation post-operatively. Please see the procedural report for the amount of contrast and the fluoroscopy time utilized.  COMPARISON:  Ultrasound of same day.  FLUOROSCOPY TIME:  16 seconds.  FINDINGS: Four sequences were obtained intraoperative lead these demonstrate contrast being injected into cannulated cystic duct remnant. No definite common bile duct or intrahepatic biliary dilatation is noted. No definite filling defects are noted. Antegrade flow into duodenum is noted.  IMPRESSION: No definite evidence of stones is seen in the common bile duct.   Electronically Signed   By: Lupita Raider, M.D.   On: 08/07/2015 13:28    Assessment/Plan: 39 year old female 2 days status post lap scopic cholecystectomy. In spite of episode of emesis she is much improved this morning. Discussed that if she tolerates breakfast and lunch will likely discharge home this afternoon.  Ricarda Frame, MD FACS General Surgeon [facility]

## 2015-08-09 NOTE — Discharge Instructions (Signed)
1.  Please contact your physician for any redness, swelling, discharge or bleeding from your incisions; pain uncontrolled by what you have been instructed to take; or fever greater than 100.4. 2.  Keep followup appointment as outlined above. 3.  Take medications as instructed.   Laparoscopic Cholecystectomy Laparoscopic cholecystectomy is surgery to remove the gallbladder. The gallbladder is located in the upper right part of the abdomen, behind the liver. It is a storage sac for bile produced in the liver. Bile aids in the digestion and absorption of fats. Cholecystectomy is often done for inflammation of the gallbladder (cholecystitis). This condition is usually caused by a buildup of gallstones (cholelithiasis) in your gallbladder. Gallstones can block the flow of bile, resulting in inflammation and pain. In severe cases, emergency surgery may be required. When emergency surgery is not required, you will have time to prepare for the procedure. Laparoscopic surgery is an alternative to open surgery. Laparoscopic surgery has a shorter recovery time. Your common bile duct may also need to be examined during the procedure. If stones are found in the common bile duct, they may be removed. LET [facility] CARE PROVIDER KNOW ABOUT:  Any allergies you have.  All medicines you are taking, including vitamins, herbs, eye drops, creams, and over-the-counter medicines.  Previous problems you or members of your family have had with the use of anesthetics.  Any blood disorders you have.  Previous surgeries you have had.  Medical conditions you have. RISKS AND COMPLICATIONS Generally, this is a safe procedure. However, as with any procedure, complications can occur. Possible complications include:  Infection.  Damage to the common bile duct, nerves, arteries, veins, or other internal organs such as the stomach, liver, or intestines.  Bleeding.  A stone may remain in the common bile duct.  A bile  leak from the cyst duct that is clipped when your gallbladder is removed.  The need to convert to open surgery, which requires a larger incision in the abdomen. This may be necessary if your surgeon thinks it is not safe to continue with a laparoscopic procedure. BEFORE THE PROCEDURE  Ask your health care provider about changing or stopping any regular medicines. You will need to stop taking aspirin or blood thinners at least 5 days prior to surgery.  Do not eat or drink anything after midnight the night before surgery.  Let your health care provider know if you develop a cold or other infectious problem before surgery. PROCEDURE   You will be given medicine to make you sleep through the procedure (general anesthetic). A breathing tube will be placed in your mouth.  When you are asleep, your surgeon will make several small cuts (incisions) in your abdomen.  A thin, lighted tube with a tiny camera on the end (laparoscope) is inserted through one of the small incisions. The camera on the laparoscope sends a picture to a TV screen in the operating room. This gives the surgeon a good view inside your abdomen.  A gas will be pumped into your abdomen. This expands your abdomen so that the surgeon has more room to perform the surgery.  Other tools needed for the procedure are inserted through the other incisions. The gallbladder is removed through one of the incisions.  After the removal of your gallbladder, the incisions will be closed with stitches, staples, or skin glue. AFTER THE PROCEDURE  You will be taken to a recovery area where your progress will be checked often.  You may be allowed to  go home the same day if your pain is controlled and you can tolerate liquids. Document Released: 12/10/2005 Document Revised: 09/30/2013 Document Reviewed: 07/22/2013 [facility] Patient Information 2015 [place], [place]. This information is not intended to replace advice given to you by your health care  provider. Make sure you discuss any questions you have with your health care provider.

## 2015-08-09 NOTE — Discharge Summary (Signed)
Patient ID: Kim Mayo MRN: [id number] DOB/AGE: 1976-05-12 39 y.o.  Admit date: 08/07/2015 Discharge date: 08/09/2015  Discharge Diagnoses:  Cholecystitis   Procedures Performed: Laparoscopic Cholecystectomy  Discharged Condition: good  Hospital Course: Taken to OR from ER for cholecystectomy. Tolerated procedure well. Able to tolerate PO and have pain controlled prior to discharge.  Discharge Orders: Discharge Instructions    Diet - low sodium heart healthy    Complete by:  As directed      Discharge wound care:    Complete by:  As directed   Change dressings after 2 days and you may bathe     Increase activity slowly    Complete by:  As directed      Remove dressing in 48 hours    Complete by:  As directed            Disposition: 01-Home or Self Care  Discharge Medications:  Current facility-administered medications:  .  acetaminophen (TYLENOL) tablet 650 mg, 650 mg, Oral, Q6H PRN **OR** acetaminophen (TYLENOL) suppository 650 mg, 650 mg, Rectal, Q6H PRN, Tiney Rouge III, MD .  ALPRAZolam Prudy Feeler) tablet 0.5 mg, 0.5 mg, Oral, TID PRN, Tiney Rouge III, MD .  diazepam (VALIUM) tablet 5 mg, 5 mg, Oral, Q6H PRN, Ricarda Frame, MD, 5 mg at 08/08/15 1846 .  enoxaparin (LOVENOX) injection 40 mg, 40 mg, Subcutaneous, Q24H, Tiney Rouge III, MD, 40 mg at 08/09/15 1033 .  HYDROmorphone (DILAUDID) tablet 2 mg, 2 mg, Oral, Q4H PRN, Ricarda Frame, MD, 2 mg at 08/09/15 0531 .  ondansetron (ZOFRAN-ODT) disintegrating tablet 4 mg, 4 mg, Oral, Q6H PRN **OR** ondansetron (ZOFRAN) injection 4 mg, 4 mg, Intravenous, Q6H PRN, Tiney Rouge III, MD, 4 mg at 08/09/15 1255 .  simethicone (MYLICON) chewable tablet 80 mg, 80 mg, Oral, QID PRN, Natale Lay, MD, 80 mg at 08/08/15 2240  Follwup: Follow-up Information    Follow up with [facility] [place]. Schedule an appointment as soon as possible for a visit in 2 weeks.   Why:  For wound re-check   Contact information:   [address] [place] [address] [phone]      Signed: Ricarda Frame 08/09/2015, 3:15 PM

## 2015-08-10 NOTE — Anesthesia Postprocedure Evaluation (Signed)
  Anesthesia Post-op Note  Patient: Kim Mayo  Procedure(s) Performed: Procedure(s): LAPAROSCOPIC CHOLECYSTECTOMY WITH INTRAOPERATIVE CHOLANGIOGRAM (N/A)  Anesthesia type:General  Patient location: PACU  Post pain: Pain level controlled  Post assessment: Post-op Vital signs reviewed, Patient's Cardiovascular Status Stable, Respiratory Function Stable, Patent Airway and No signs of Nausea or vomiting  Post vital signs: Reviewed and stable  Last Vitals:  Filed Vitals:   08/09/15 0810  BP: 91/43  Pulse: 90  Temp: 37.4 C  Resp: 16    Level of consciousness: awake, alert  and patient cooperative  Complications: No apparent anesthesia complications

## 2015-08-15 ENCOUNTER — Telehealth: Payer: Self-pay | Admitting: Surgery

## 2015-08-15 NOTE — Telephone Encounter (Signed)
Please call patient she has bene vomiting since surgery and cant keep anything down, Eating a lot of zofran patient is having a really hard time

## 2015-08-15 NOTE — Telephone Encounter (Signed)
Returned patient call. Patient reports severe nausea with some vomiting since her surgery with no relief when taking Zofran. Patient also reports a 17 lb weight loss since the surgery due to not be able to keep food down. Patient directed to go immediately to the ED for treatment. Patient confirmed understanding of directions.

## 2015-08-23 ENCOUNTER — Encounter: Payer: Self-pay | Admitting: Surgery

## 2015-08-23 ENCOUNTER — Ambulatory Visit (INDEPENDENT_AMBULATORY_CARE_PROVIDER_SITE_OTHER): Payer: BLUE CROSS/BLUE SHIELD | Admitting: Surgery

## 2015-08-23 ENCOUNTER — Other Ambulatory Visit: Payer: Self-pay

## 2015-08-23 VITALS — BP 124/76 | HR 72 | Temp 97.7°F | Ht 65.0 in | Wt 208.0 lb

## 2015-08-23 DIAGNOSIS — K8000 Calculus of gallbladder with acute cholecystitis without obstruction: Secondary | ICD-10-CM

## 2015-08-23 MED ORDER — ONDANSETRON 4 MG PO TBDP: 4.0000 mg | ORAL_TABLET | Freq: Three times a day (TID) | ORAL | Status: DC | PRN

## 2015-08-23 NOTE — Patient Instructions (Signed)
Patient to follow up as needed

## 2015-08-23 NOTE — Progress Notes (Signed)
Outpatient Surgical Follow Up  08/23/2015  Kim Mayo is an 39 y.o. female.   Chief Complaint  Patient presents with  . Routine Post Op    Laparoscopic cholecystectomy     HPI: She returns following her laparoscopic cholecystectomy. She had symptomatic biliary tract disease. She has been doing very well post surgery until the last several days. She had some mild nausea and overall felt poorly. She works in a physician's office and was able to get a urine study which didn't demonstrate evidence for possible urinary tract infection. Her primary care doctor put her on Cipro and she's feeling much better. She continues to have some mild nausea. Overall she's now. Her basic metabolic panel was unremarkable.  Past Medical History  Diagnosis Date  . Anxiety     Past Surgical History  Procedure Laterality Date  . Breast surgery    . Appendectomy    . Cholecystectomy N/A 08/07/2015    Procedure: LAPAROSCOPIC CHOLECYSTECTOMY WITH INTRAOPERATIVE CHOLANGIOGRAM;  Surgeon: Tiney Rouge III, MD;  Location: [facility] ORS;  Service: General;  Laterality: N/A;    Family History  Problem Relation Age of Onset  . Cancer Mother   . Heart disease Father   . Hypertension Father   . Thyroid disease Father     Social History:  reports that she has never smoked. She has never used smokeless tobacco. She reports that she drinks alcohol. She reports that she does not use illicit drugs.  Allergies:  Allergies  Allergen Reactions  . Cephalexin     REACTION: Itchy, eyes swelling.  . Codeine     REACTION: Nausea, vomiting and rash  . Oxycodone-Acetaminophen     REACTION: Nausea, vomiting and rash    Medications reviewed.    ROS    BP 124/76 mmHg  Pulse 72  Temp(Src) 97.7 F (36.5 C) (Oral)  Ht  (1.651 m)  Wt 208 lb (94.348 kg)  BMI 34.61 kg/m2  Physical Exam her wounds look good. There is no sign of any infection. Her abdomen is otherwise benign.     No results found for this or  any previous visit (from the past 48 hour(s)). No results found.  Assessment/Plan:  1. Calculus of gallbladder with acute cholecystitis without obstruction She continues to improve. I'm not certain that her symptoms are all related to her UTI. I have encouraged her to call us if she has continued symptoms and we would repeat her liver panel and possibly an ultrasound postoperatively. She is in agreement with this plan. We will refill her Zofran.     Tiney Rouge III  08/23/2015,negative

## 2019-06-15 ENCOUNTER — Other Ambulatory Visit: Payer: Self-pay | Admitting: Family Medicine

## 2019-06-15 DIAGNOSIS — N6320 Unspecified lump in the left breast, unspecified quadrant: Secondary | ICD-10-CM

## 2019-06-22 ENCOUNTER — Other Ambulatory Visit: Payer: BLUE CROSS/BLUE SHIELD

## 2019-11-23 ENCOUNTER — Ambulatory Visit: Payer: Medicaid Other | Attending: Family Medicine | Admitting: Physical Therapy

## 2019-11-30 ENCOUNTER — Ambulatory Visit: Payer: Medicaid Other | Admitting: Physical Therapy

## 2019-12-07 ENCOUNTER — Encounter: Payer: Medicaid Other | Admitting: Physical Therapy

## 2019-12-14 ENCOUNTER — Encounter: Payer: Medicaid Other | Admitting: Physical Therapy

## 2019-12-21 ENCOUNTER — Encounter: Payer: Medicaid Other | Admitting: Physical Therapy

## 2021-07-25 ENCOUNTER — Ambulatory Visit
Admission: EM | Admit: 2021-07-25 | Discharge: 2021-07-25 | Disposition: A | Discharge status: Home / Self Care | Payer: Medicaid Other

## 2021-07-25 ENCOUNTER — Ambulatory Visit (INDEPENDENT_AMBULATORY_CARE_PROVIDER_SITE_OTHER): Payer: Medicaid Other

## 2021-07-25 ENCOUNTER — Encounter: Payer: Self-pay | Admitting: Emergency Medicine

## 2021-07-25 ENCOUNTER — Other Ambulatory Visit: Payer: Self-pay

## 2021-07-25 DIAGNOSIS — S93402A Sprain of unspecified ligament of left ankle, initial encounter: Secondary | ICD-10-CM | POA: Diagnosis not present

## 2021-07-25 DIAGNOSIS — M25572 Pain in left ankle and joints of left foot: Secondary | ICD-10-CM

## 2021-07-25 NOTE — ED Triage Notes (Signed)
Pt presents today with c/o of left ankle pain. She reports twisting while walking down stairs this afternoon. She said she heard a pop. Pain with ambulation.

## 2021-07-25 NOTE — Discharge Instructions (Addendum)
SPRAIN: X-rays are negative for fractures. Stressed avoiding painful activities . Reviewed RICE guidelines. Use medications as directed.  You can take Tylenol for pain relief.  You can also use over-the-counter Voltaren gel to apply locally to the area.  I would avoid NSAIDs if you have the history of recent stomach ulcer.  We have provided with a walking boot.  Try to stay off it is much as possible and elevate as well as apply ice.  If no improvement in the next 1-2 weeks, f/u with PCP or follow-up with orthopedics.  Can go to [facility] urgent care in [place].

## 2021-07-25 NOTE — ED Provider Notes (Signed)
[facility]    CSN: [id number] Arrival date & time: 07/25/21  1716      History   Chief Complaint Chief Complaint  Patient presents with   Ankle Pain    left    HPI Kim Mayo is a 45 y.o. female presenting for left ankle pain and swelling following an accidental fall today.  Patient states that she was walking downstairs and twisted the ankle.  She says that she heard a "pop."  Patient notes that she can apply weight and walk on the affected foot/ankle but it causes pain.  Patient admits to diffuse pain throughout the lateral ankle and heel.  She says the whole foot feels tingly.  She has applied ice and taken ibuprofen about an hour ago.  Patient states she has repeatedly "rolled the ankle."  She denies any previous fractures of the affected foot or ankle though.  No other injuries, complaints or concerns.  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      Home Medications    Prior to Admission medications   Medication Sig Start Date End Date Taking? Authorizing Provider  ALPRAZolam Prudy Feeler) 0.5 MG tablet Take 1 tablet by mouth 3 (three) times daily as needed for anxiety.     [provider]  ALPRAZolam Prudy Feeler) 0.5 MG tablet Take 1 tablet by mouth daily as needed.    [provider]  ciprofloxacin (CIPRO) 500 MG tablet  08/22/15   [provider]  dicyclomine (BENTYL) 10 MG capsule Take 10-20 mg by mouth 4 (four)  times daily as needed. 06/27/21   [provider]  HYDROmorphone (DILAUDID) 2 MG tablet Take 1 tablet (2 mg total) by mouth every 4 (four) hours as needed for severe pain. Patient not taking: No sig reported 08/09/15   Ricarda Frame, MD  ondansetron (ZOFRAN ODT) 4 MG disintegrating tablet Take 1 tablet (4 mg total) by mouth every 8 (eight) hours as needed for nausea or vomiting. 08/23/15   Salley Hews, MD    Family History Family History  Problem Relation Age of Onset   Cancer Mother    Heart disease Father    Hypertension Father    Thyroid disease Father     Social History Social History   Tobacco Use   Smoking status: Never   Smokeless tobacco: Never  Vaping Use   Vaping Use: Never used  Substance Use Topics   Alcohol use: Yes    Comment: occasionally   Drug use: No     Allergies   Cephalexin, Codeine, and Oxycodone-acetaminophen   Review of Systems Review of Systems  Musculoskeletal:  Positive for arthralgias, gait problem and joint swelling.  Skin:  Negative for color change and wound.  Neurological:  Negative for weakness and numbness.    Physical Exam Triage Vital Signs ED Triage Vitals  Enc Vitals Group     BP 07/25/21 1734  138/86     Pulse Rate 07/25/21 1734 76     Resp 07/25/21 1734 18     Temp 07/25/21 1734 98.4 F (36.9 C)     Temp Source 07/25/21 1734 Oral     SpO2 --      Weight 07/25/21 1731 185 lb (83.9 kg)     Height 07/25/21 1731 5\' 5"  (1.651 m)     Head Circumference --      Peak Flow --      Pain Score 07/25/21 1730 8     Pain Loc --      Pain Edu? --      Excl. in GC? --    No data found.  Updated Vital Signs BP 138/86 (BP Location: Left Arm)   Pulse 76   Temp 98.4 F (36.9 C) (Oral)   Resp 18   Ht 5\' 5"  (1.651 m)   Wt 185 lb (83.9 kg)   LMP 07/24/2021   BMI 30.79 kg/m       Physical Exam Vitals and nursing note reviewed.  Constitutional:      General: She is not in acute distress.    Appearance: Normal  appearance. She is not ill-appearing or toxic-appearing.  HENT:     Head: Normocephalic and atraumatic.  Eyes:     General: No scleral icterus.       Right eye: No discharge.        Left eye: No discharge.     Conjunctiva/sclera: Conjunctivae normal.  Cardiovascular:     Rate and Rhythm: Normal rate and regular rhythm.     Pulses: Normal pulses.  Pulmonary:     Effort: Pulmonary effort is normal. No respiratory distress.  Musculoskeletal:     Cervical back: Neck supple.     Left ankle: Swelling (moderate swelling lateral ankle and foot) present. Tenderness present over the lateral malleolus, ATF ligament and base of 5th metatarsal. Normal range of motion.  Skin:    General: Skin is dry.  Neurological:     General: No focal deficit present.     Mental Status: She is alert. Mental status is at baseline.     Motor: No weakness.     Gait: Gait abnormal.  Psychiatric:        Mood and Affect: Mood normal.        Behavior: Behavior normal.        Thought Content: Thought content normal.     UC Treatments / Results  Labs (all labs ordered are listed, but only abnormal results are displayed) Labs Reviewed - No data to display  EKG   Radiology DG Ankle Complete Left  Result Date: 07/25/2021 CLINICAL DATA:  Left foot and ankle pain after twisting injury walking down stairs this afternoon. Heard a popliteal EXAM: LEFT ANKLE COMPLETE - 3+ VIEW COMPARISON:  None. FINDINGS: No acute fracture or dislocation. There is a small ankle joint effusion. Minimal talonavicular spurring. Moderate plantar calcaneal spur. Mild soft tissue edema anterolateral. IMPRESSION: Soft tissue edema and small joint effusion. No acute fracture or dislocation. Electronically Signed   By: 09/23/2021 M.D.   On: 07/25/2021 18:34   DG Foot Complete Left  Result Date: 07/25/2021 CLINICAL DATA:  Left foot and ankle pain after twisting injury walking down stairs this afternoon. Heard a pop. EXAM: LEFT FOOT -  COMPLETE 3+ VIEW COMPARISON:  None. FINDINGS: There is no evidence of fracture or dislocation. Well corticated osseous density adjacent to the base of the  fifth metatarsal may represent sequela of remote injury or an accessory ossicle. Moderate plantar calcaneal spur. Soft tissues are unremarkable. IMPRESSION: 1. No fracture or subluxation of the left foot. 2. Moderate plantar calcaneal spur. Electronically Signed   By: Narda Rutherford M.D.   On: 07/25/2021 18:32    Procedures Procedures (including critical care time)  Medications Ordered in UC Medications - No data to display  Initial Impression / Assessment and Plan / UC Course  I have reviewed the triage vital signs and the nursing notes.  Pertinent labs & imaging results that were available during my care of the patient were reviewed by me and considered in my medical decision making (see chart for details).  45 year old female presenting for left foot and ankle pain following an accidental fall earlier today.  X-rays obtained of the foot and ankle.  X-rays independently viewed by me.  X-rays reveal; no fractures.  No acute findings.  Reviewed results with patient.  Advised patient she has a sprained ankle.  Suggested supportive brace.  Patient requested a cam walking boot because she says she has to be on her feet for her job.  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  Patient placed in cam walker boot.  Patient advised to take Tylenol for pain relief and follow RICE guidelines.  Patient states she had a stomach ulcer that she just recently got over and does not like to take NSAIDs.  Advised she can apply topical Voltaren to the area.  Advised following up with Ortho if not improving over the next 7 to 10 days or if symptoms are worsening.  Offered work note but she declined.   Final Clinical Impressions(s) / UC Diagnoses   Final diagnoses:  Sprain of left ankle, unspecified ligament, initial encounter  Acute left ankle pain     Discharge  Instructions      SPRAIN: X-rays are negative for fractures. Stressed avoiding painful activities . Reviewed RICE guidelines. Use medications as directed.  You can take Tylenol for pain relief.  You can also use over-the-counter Voltaren gel to apply locally to the area.  I would avoid NSAIDs if you have the history of recent stomach ulcer.  We have provided with a walking boot.  Try to stay off it is much as possible and elevate as well as apply ice.  If no improvement in the next 1-2 weeks, f/u with PCP or follow-up with orthopedics.  Can go to [facility] urgent care in [place].        ED Prescriptions   None    PDMP not reviewed this encounter.   Shirlee Latch, PA-C 07/25/21 1858

## 2021-09-13 ENCOUNTER — Ambulatory Visit (INDEPENDENT_AMBULATORY_CARE_PROVIDER_SITE_OTHER): Payer: Medicaid Other

## 2021-09-13 ENCOUNTER — Ambulatory Visit
Admission: EM | Admit: 2021-09-13 | Discharge: 2021-09-13 | Disposition: A | Discharge status: Home / Self Care | Payer: Medicaid Other | Attending: Physician Assistant | Admitting: Physician Assistant

## 2021-09-13 DIAGNOSIS — R051 Acute cough: Secondary | ICD-10-CM | POA: Diagnosis present

## 2021-09-13 DIAGNOSIS — R0602 Shortness of breath: Secondary | ICD-10-CM

## 2021-09-13 DIAGNOSIS — Z20822 Contact with and (suspected) exposure to covid-19: Secondary | ICD-10-CM | POA: Insufficient documentation

## 2021-09-13 DIAGNOSIS — R059 Cough, unspecified: Secondary | ICD-10-CM | POA: Diagnosis not present

## 2021-09-13 DIAGNOSIS — Z885 Allergy status to narcotic agent status: Secondary | ICD-10-CM | POA: Insufficient documentation

## 2021-09-13 DIAGNOSIS — Z881 Allergy status to other antibiotic agents status: Secondary | ICD-10-CM | POA: Diagnosis not present

## 2021-09-13 DIAGNOSIS — J209 Acute bronchitis, unspecified: Secondary | ICD-10-CM | POA: Diagnosis not present

## 2021-09-13 DIAGNOSIS — Z79899 Other long term (current) drug therapy: Secondary | ICD-10-CM | POA: Insufficient documentation

## 2021-09-13 DIAGNOSIS — R112 Nausea with vomiting, unspecified: Secondary | ICD-10-CM | POA: Diagnosis not present

## 2021-09-13 MED ORDER — PROMETHAZINE-DM 6.25-15 MG/5ML PO SYRP: 5.0000 mL | ORAL_SOLUTION | Freq: Four times a day (QID) | ORAL | 0 refills | Status: DC | PRN

## 2021-09-13 MED ORDER — PREDNISONE 10 MG PO TABS: 40.0000 mg | ORAL_TABLET | Freq: Every day | ORAL | 0 refills | Status: DC

## 2021-09-13 MED ORDER — ONDANSETRON 8 MG PO TBDP: 8.0000 mg | ORAL_TABLET | Freq: Three times a day (TID) | ORAL | 0 refills | Status: DC | PRN

## 2021-09-13 NOTE — ED Provider Notes (Signed)
[facility]    CSN: [id number] Arrival date & time: 09/13/21  1645      History   Chief Complaint Chief Complaint  Patient presents with   Cough   Emesis    HPI Kim Mayo is a 45 y.o. female presenting for 4-day history of fatigue, body aches, cough and congestion as well as feeling dizzy and lightheaded and having posttussive vomiting.  Patient also admits to nausea.  She denies any fevers or sore throat.  Patient admits to feeling short of breath at times.  She did see her PCP yesterday and was advised that she had bronchitis.  Patient was given a corticosteroid IM injection and albuterol.  Patient has been using albuterol at home which has helped.  She has not been taking any other medications.  She had a negative rapid in clinic COVID test yesterday.  Patient reports history of suspected stomach ulcers.  Patient has an appointment in the next week and a half with GI for upper endoscopy.  Patient reports that she has been taking Zofran as prescribed by GI specialist which has helped some.  She has been taking 4 mg ODT Zofran.  She has no other complaints.  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      Home Medications    Prior to Admission medications   Medication Sig Start Date End Date Taking? Authorizing Provider  ALPRAZolam Prudy Feeler) 1 MG tablet Take 1 mg by mouth 3 (three) times daily as needed. 09/04/21  Yes [provider]  dicyclomine (BENTYL) 10 MG capsule Take 10-20 mg by mouth 4 (four) times daily as needed. 06/27/21  Yes [provider]  lansoprazole (PREVACID) 30 MG capsule Take 30 mg by mouth 2 (two) times daily. 05/03/21  Yes [provider]  ondansetron (ZOFRAN ODT) 8 MG disintegrating tablet Take 1 tablet (8 mg total) by mouth every 8 (eight) hours as needed for nausea or vomiting. 09/13/21  Yes Eusebio Friendly B, PA-C  pantoprazole (PROTONIX) 40 MG tablet Take 40 mg by mouth 2 (two) times daily. 08/16/21  Yes [provider]  PARoxetine (PAXIL) 20 MG tablet Take by mouth.   Yes [provider]  predniSONE (DELTASONE) 10 MG tablet Take 4 tablets (40 mg total) by mouth daily with breakfast for 5 days. 09/13/21 09/18/21 Yes Shirlee Latch, PA-C  promethazine-dextromethorphan (PROMETHAZINE-DM) 6.25-15 MG/5ML syrup Take 5 mLs by mouth 4 (four) times daily as needed for cough. 09/13/21  Yes Eusebio Friendly B, PA-C  sucralfate (CARAFATE) 1 g tablet Take 1 g by mouth 4 (four) times daily. 06/01/21  Yes [provider]  ciprofloxacin (CIPRO) 500 MG tablet  08/22/15   [provider]  HYDROmorphone (DILAUDID) 2 MG tablet Take 1 tablet (2 mg total) by mouth every 4 (four) hours as needed for severe pain. Patient not taking: No sig reported 08/09/15   Ricarda Frame,  MD    Family History Family History  Problem Relation Age of Onset   Cancer Mother    Heart disease Father    Hypertension Father    Thyroid disease Father     Social History Social History   Tobacco Use   Smoking status: Never   Smokeless tobacco: Never  Vaping Use   Vaping Use: Never used  Substance Use Topics   Alcohol use: Yes    Comment: occasionally   Drug use: No     Allergies   Hydrocodone-acetaminophen, Oxycodone-acetaminophen, Cephalexin, Oxycodone-acetaminophen, and Codeine   Review of Systems Review of Systems  Constitutional:  Positive for fatigue. Negative for chills,  diaphoresis and fever.  HENT:  Positive for congestion and rhinorrhea. Negative for ear pain, sinus pressure, sinus pain and sore throat.   Respiratory:  Positive for cough and shortness of breath.   Cardiovascular:  Negative for chest pain.  Gastrointestinal:  Positive for nausea and vomiting. Negative for abdominal pain.  Musculoskeletal:  Positive for myalgias. Negative for arthralgias.  Skin:  Negative for rash.  Neurological:  Positive for headaches. Negative for weakness.  Hematological:  Negative for adenopathy.    Physical Exam Triage Vital Signs ED Triage Vitals  Enc Vitals Group     BP 09/13/21 1749 (!) 127/100     Pulse Rate 09/13/21 1749 93     Resp 09/13/21 1749 18     Temp 09/13/21 1749 98.5 F (36.9 C)     Temp Source 09/13/21 1749 Oral     SpO2 09/13/21 1749 100 %     Weight 09/13/21 1745 166 lb (75.3 kg)     Height 09/13/21 1745 5\' 5"  (1.651 m)     Head Circumference --      Peak Flow --      Pain Score 09/13/21 1744 8     Pain Loc --      Pain Edu? --      Excl. in GC? --    No data found.  Updated Vital Signs BP (!) 127/100 (BP Location: Left Arm)   Pulse 93   Temp 98.5 F (36.9 C) (Oral)   Resp 18   Ht 5\' 5"  (1.651 m)   Wt 166 lb (75.3 kg)   SpO2 100%   BMI 27.62 kg/m      Physical Exam Vitals and nursing note reviewed.  Constitutional:      General: She is not in acute distress.    Appearance: Normal appearance. She is ill-appearing. She is not toxic-appearing or diaphoretic.  HENT:     Head: Normocephalic and atraumatic.     Right Ear: Tympanic membrane, ear canal and external ear normal.     Left Ear: Tympanic membrane, ear canal and external ear normal.     Nose: Congestion present.     Mouth/Throat:     Mouth: Mucous membranes are moist.     Pharynx: Oropharynx is clear.  Eyes:     General: No scleral icterus.       Right eye: No discharge.        Left eye: No discharge.     Conjunctiva/sclera: Conjunctivae normal.   Cardiovascular:     Rate and Rhythm: Normal rate and regular rhythm.     Heart sounds: Normal heart sounds.  Pulmonary:     Effort: Pulmonary effort is normal. No respiratory distress.     Breath sounds: Normal breath sounds. No wheezing, rhonchi or rales.  Musculoskeletal:  Cervical back: Neck supple.  Skin:    General: Skin is dry.  Neurological:     General: No focal deficit present.     Mental Status: She is alert. Mental status is at baseline.     Motor: No weakness.     Gait: Gait normal.  Psychiatric:        Mood and Affect: Mood normal.        Behavior: Behavior normal.        Thought Content: Thought content normal.     UC Treatments / Results  Labs (all labs ordered are listed, but only abnormal results are displayed) Labs Reviewed  SARS CORONAVIRUS 2 (TAT 6-24 HRS)    EKG   Radiology DG Chest 2 View  Result Date: 09/13/2021 CLINICAL DATA:  Cough and congestion. Shortness of breath. Symptoms for 4 days. EXAM: CHEST - 2 VIEW COMPARISON:  None. FINDINGS: The cardiomediastinal contours are normal. Mild bronchial thickening. Pulmonary vasculature is normal. No consolidation, pleural effusion, or pneumothorax. No acute osseous abnormalities are seen. IMPRESSION: Mild bronchial thickening which can be seen with bronchitis or asthma. No pneumonia. Electronically Signed   By: Narda Rutherford M.D.   On: 09/13/2021 19:02    Procedures Procedures (including critical care time)  Medications Ordered in UC Medications - No data to display  Initial Impression / Assessment and Plan / UC Course  I have reviewed the triage vital signs and the nursing notes.  Pertinent labs & imaging results that were available during my care of the patient were reviewed by me and considered in my medical decision making (see chart for details).   45 year old female presenting for 4-day history of fatigue, cough and congestion.  Admits to shortness of breath.  She did see her primary care  provider yesterday and was advised she had bronchitis.  Treated with 1 injection of corticosteroid and breathing treatments.  Vital signs all stable today.  She is ill-appearing but nontoxic.  She has congestion on exam.  Chest is clear to auscultation heart regular rate and rhythm.  Chest x-ray obtained today is consistent with bronchitis but no evidence of pneumonia.  PCR COVID test obtained.  Current CDC guidelines, isolation protocol and ED precautions reviewed with patient.  Patient advised symptoms consistent with viral illness.  I have sent in prednisone for the bronchitis and also Promethazine DM and Zofran.  Advise close follow-up and to follow-up with PCP if not improving or symptoms worsen.  ED precautions reviewed.   Final Clinical Impressions(s) / UC Diagnoses   Final diagnoses:  Acute bronchitis, unspecified organism  Cough  Non-intractable vomiting with nausea, unspecified vomiting type     Discharge Instructions      -The x-ray is consistent with bronchitis but no evidence of pneumonia.  Most cases of bronchitis are viral.  COVID test is pending and it could be COVID-19.  Result be available tomorrow we will call you if positive.  If COVID-positive you will need to be isolated 5 days from symptom onset and wear mask for 5 days.  When -I have sent more prednisone, Zofran and a cough medication.  Increase rest and fluids.  Use your albuterol at home if needed for any shortness of breath.  Follow-up with PCP if not feeling better over the next week or if symptoms worsen.   -Go to emergency department for uncontrollable fever, worsening cough or increased breathing difficulty     ED Prescriptions     Medication Sig Dispense Auth. Provider  promethazine-dextromethorphan (PROMETHAZINE-DM) 6.25-15 MG/5ML syrup Take 5 mLs by mouth 4 (four) times daily as needed for cough. 118 mL Eusebio Friendly B, PA-C   ondansetron (ZOFRAN ODT) 8 MG disintegrating tablet Take 1 tablet (8 mg  total) by mouth every 8 (eight) hours as needed for nausea or vomiting. 20 tablet Eusebio Friendly B, PA-C   predniSONE (DELTASONE) 10 MG tablet Take 4 tablets (40 mg total) by mouth daily with breakfast for 5 days. 20 tablet Gareth Morgan      PDMP not reviewed this encounter.   Shirlee Latch, PA-C 09/13/21 1912

## 2021-09-13 NOTE — Discharge Instructions (Signed)
-  The x-ray is consistent with bronchitis but no evidence of pneumonia.  Most cases of bronchitis are viral.  COVID test is pending and it could be COVID-19.  Result be available tomorrow we will call you if positive.  If COVID-positive you will need to be isolated 5 days from symptom onset and wear mask for 5 days.  When -I have sent more prednisone, Zofran and a cough medication.  Increase rest and fluids.  Use your albuterol at home if needed for any shortness of breath.  Follow-up with PCP if not feeling better over the next week or if symptoms worsen.   -Go to emergency department for uncontrollable fever, worsening cough or increased breathing difficulty

## 2021-09-13 NOTE — ED Triage Notes (Signed)
Pt c/o cough since Saturday, states she was evaluated yesterday and told she has bronchitis. Pt reports she was given albuterol nebs and a steroid shot. Pt states she is getting worse, reports constant cough and lightheadedness with emesis. Pt denies fever. Pt does have hx of GI issues.

## 2021-09-14 LAB — SARS CORONAVIRUS 2 (TAT 6-24 HRS): SARS Coronavirus 2: NEGATIVE

## 2021-09-15 ENCOUNTER — Encounter
Admission: EM | Disposition: A | Discharge status: Home / Self Care | Payer: Self-pay | Source: Home / Self Care | Attending: Emergency Medicine

## 2021-09-15 ENCOUNTER — Other Ambulatory Visit: Payer: Self-pay

## 2021-09-15 ENCOUNTER — Observation Stay: Payer: Medicaid Other | Admitting: Registered Nurse

## 2021-09-15 ENCOUNTER — Observation Stay
Admission: EM | Admit: 2021-09-15 | Discharge: 2021-09-16 | Disposition: A | Discharge status: Home / Self Care | Payer: Medicaid Other | Attending: Internal Medicine | Admitting: Internal Medicine

## 2021-09-15 ENCOUNTER — Emergency Department: Payer: Medicaid Other

## 2021-09-15 DIAGNOSIS — R112 Nausea with vomiting, unspecified: Secondary | ICD-10-CM | POA: Diagnosis present

## 2021-09-15 DIAGNOSIS — K219 Gastro-esophageal reflux disease without esophagitis: Secondary | ICD-10-CM | POA: Diagnosis present

## 2021-09-15 DIAGNOSIS — Z79899 Other long term (current) drug therapy: Secondary | ICD-10-CM | POA: Diagnosis not present

## 2021-09-15 DIAGNOSIS — R197 Diarrhea, unspecified: Secondary | ICD-10-CM | POA: Diagnosis present

## 2021-09-15 DIAGNOSIS — E876 Hypokalemia: Secondary | ICD-10-CM | POA: Diagnosis not present

## 2021-09-15 DIAGNOSIS — Z20822 Contact with and (suspected) exposure to covid-19: Secondary | ICD-10-CM | POA: Diagnosis not present

## 2021-09-15 DIAGNOSIS — R1013 Epigastric pain: Secondary | ICD-10-CM | POA: Diagnosis present

## 2021-09-15 DIAGNOSIS — R519 Headache, unspecified: Secondary | ICD-10-CM | POA: Diagnosis not present

## 2021-09-15 DIAGNOSIS — Z8616 Personal history of COVID-19: Secondary | ICD-10-CM | POA: Insufficient documentation

## 2021-09-15 DIAGNOSIS — K2100 Gastro-esophageal reflux disease with esophagitis, without bleeding: Secondary | ICD-10-CM

## 2021-09-15 DIAGNOSIS — K2950 Unspecified chronic gastritis without bleeding: Principal | ICD-10-CM | POA: Insufficient documentation

## 2021-09-15 DIAGNOSIS — K2970 Gastritis, unspecified, without bleeding: Secondary | ICD-10-CM | POA: Diagnosis not present

## 2021-09-15 DIAGNOSIS — F418 Other specified anxiety disorders: Secondary | ICD-10-CM | POA: Diagnosis not present

## 2021-09-15 DIAGNOSIS — G44209 Tension-type headache, unspecified, not intractable: Secondary | ICD-10-CM | POA: Diagnosis present

## 2021-09-15 HISTORY — DX: Bronchitis, not specified as acute or chronic: J40

## 2021-09-15 HISTORY — PX: ESOPHAGOGASTRODUODENOSCOPY (EGD) WITH PROPOFOL: SHX5813

## 2021-09-15 HISTORY — DX: Depression, unspecified: F32.A

## 2021-09-15 LAB — URINALYSIS, COMPLETE (UACMP) WITH MICROSCOPIC
Bilirubin Urine: NEGATIVE
Glucose, UA: NEGATIVE mg/dL
Ketones, ur: NEGATIVE mg/dL
Nitrite: POSITIVE — AB
Protein, ur: NEGATIVE mg/dL
Specific Gravity, Urine: 1.014 (ref 1.005–1.030)
WBC, UA: >50 WBC/hpf — ABNORMAL HIGH (ref 0–5)
pH: 5 (ref 5.0–8.0)

## 2021-09-15 LAB — CBC
HCT: 42.5 % (ref 36.0–46.0)
Hemoglobin: 15.5 g/dL — ABNORMAL HIGH (ref 12.0–15.0)
MCH: 32.8 pg (ref 26.0–34.0)
MCHC: 36.5 g/dL — ABNORMAL HIGH (ref 30.0–36.0)
MCV: 89.9 fL (ref 80.0–100.0)
Platelets: 211 10*3/uL (ref 150–400)
RBC: 4.73 MIL/uL (ref 3.87–5.11)
RDW: 13.1 % (ref 11.5–15.5)
WBC: 4.1 10*3/uL (ref 4.0–10.5)
nRBC: 0 % (ref 0.0–0.2)

## 2021-09-15 LAB — COMPREHENSIVE METABOLIC PANEL
ALT: 14 U/L (ref 0–44)
AST: 20 U/L (ref 15–41)
Albumin: 4.3 g/dL (ref 3.5–5.0)
Alkaline Phosphatase: 68 U/L (ref 38–126)
Anion gap: 8 (ref 5–15)
BUN: 10 mg/dL (ref 6–20)
CO2: 26 mmol/L (ref 22–32)
Calcium: 8.9 mg/dL (ref 8.9–10.3)
Chloride: 102 mmol/L (ref 98–111)
Creatinine, Ser: 1.05 mg/dL — ABNORMAL HIGH (ref 0.44–1.00)
GFR, Estimated: >60 mL/min (ref >60)
Glucose, Bld: 77 mg/dL (ref 70–99)
Potassium: 3.2 mmol/L — ABNORMAL LOW (ref 3.5–5.1)
Sodium: 136 mmol/L (ref 135–145)
Total Bilirubin: 0.8 mg/dL (ref 0.3–1.2)
Total Protein: 8 g/dL (ref 6.5–8.1)

## 2021-09-15 LAB — PROTIME-INR
INR: 1.1 (ref 0.8–1.2)
Prothrombin Time: 14 seconds (ref 11.4–15.2)

## 2021-09-15 LAB — RESP PANEL BY RT-PCR (FLU A&B, COVID) ARPGX2
Influenza A by PCR: NEGATIVE
Influenza B by PCR: NEGATIVE
SARS Coronavirus 2 by RT PCR: NEGATIVE

## 2021-09-15 LAB — HCG, QUANTITATIVE, PREGNANCY: hCG, Beta Chain, Quant, S: <1 m[IU]/mL (ref <5)

## 2021-09-15 LAB — HIV ANTIBODY (ROUTINE TESTING W REFLEX): HIV Screen 4th Generation wRfx: NONREACTIVE

## 2021-09-15 LAB — LIPASE, BLOOD: Lipase: 30 U/L (ref 11–51)

## 2021-09-15 LAB — TSH: TSH: 1.778 u[IU]/mL (ref 0.350–4.500)

## 2021-09-15 LAB — T4, FREE: Free T4: 0.88 ng/dL (ref 0.61–1.12)

## 2021-09-15 LAB — POC URINE PREG, ED: Preg Test, Ur: NEGATIVE

## 2021-09-15 LAB — MAGNESIUM: Magnesium: 1.8 mg/dL (ref 1.7–2.4)

## 2021-09-15 SURGERY — ESOPHAGOGASTRODUODENOSCOPY (EGD) WITH PROPOFOL
Anesthesia: General

## 2021-09-15 MED ORDER — SODIUM CHLORIDE 0.9 % IV SOLN: Freq: Once | INTRAVENOUS | Status: AC

## 2021-09-15 MED ORDER — ALBUTEROL SULFATE (2.5 MG/3ML) 0.083% IN NEBU
2.5000 mg | INHALATION_SOLUTION | RESPIRATORY_TRACT | Status: DC | PRN
  Filled 2021-09-15: qty 3

## 2021-09-15 MED ORDER — PROPOFOL 500 MG/50ML IV EMUL
INTRAVENOUS | Status: DC | PRN
  Administered 2021-09-15: 150 ug/kg/min via INTRAVENOUS

## 2021-09-15 MED ORDER — ENOXAPARIN SODIUM 40 MG/0.4ML IJ SOSY
40.0000 mg | PREFILLED_SYRINGE | INTRAMUSCULAR | Status: DC
  Administered 2021-09-16: 40 mg via SUBCUTANEOUS
  Filled 2021-09-15: qty 0.4

## 2021-09-15 MED ORDER — HYDROMORPHONE HCL 1 MG/ML IJ SOLN: 1.0000 mg | INTRAMUSCULAR | Status: DC | PRN

## 2021-09-15 MED ORDER — ACETAMINOPHEN 325 MG PO TABS: 650.0000 mg | ORAL_TABLET | Freq: Four times a day (QID) | ORAL | Status: DC | PRN

## 2021-09-15 MED ORDER — PANTOPRAZOLE SODIUM 40 MG IV SOLR
40.0000 mg | Freq: Two times a day (BID) | INTRAVENOUS | Status: DC
  Filled 2021-09-15: qty 40

## 2021-09-15 MED ORDER — INFLUENZA VAC SPLIT QUAD 0.5 ML IM SUSY
0.5000 mL | PREFILLED_SYRINGE | INTRAMUSCULAR | Status: DC
  Filled 2021-09-15: qty 0.5

## 2021-09-15 MED ORDER — PAROXETINE HCL 20 MG PO TABS
20.0000 mg | ORAL_TABLET | Freq: Every day | ORAL | Status: DC
  Administered 2021-09-15 – 2021-09-16 (×2): 20 mg via ORAL
  Filled 2021-09-15 (×2): qty 1

## 2021-09-15 MED ORDER — LIDOCAINE HCL (PF) 2 % IJ SOLN
INTRAMUSCULAR | Status: AC
  Filled 2021-09-15: qty 5

## 2021-09-15 MED ORDER — DEXMEDETOMIDINE (PRECEDEX) IN NS 20 MCG/5ML (4 MCG/ML) IV SYRINGE
PREFILLED_SYRINGE | INTRAVENOUS | Status: DC | PRN
  Administered 2021-09-15: 8 ug via INTRAVENOUS

## 2021-09-15 MED ORDER — SUCRALFATE 1 G PO TABS
1.0000 g | ORAL_TABLET | Freq: Four times a day (QID) | ORAL | Status: DC
  Administered 2021-09-15 – 2021-09-16 (×4): 1 g via ORAL
  Filled 2021-09-15 (×4): qty 1

## 2021-09-15 MED ORDER — SODIUM CHLORIDE 0.9 % IV BOLUS
1000.0000 mL | Freq: Once | INTRAVENOUS | Status: AC
  Administered 2021-09-15: 1000 mL via INTRAVENOUS

## 2021-09-15 MED ORDER — IOHEXOL 350 MG/ML SOLN: 80.0000 mL | Freq: Once | INTRAVENOUS | Status: DC | PRN

## 2021-09-15 MED ORDER — LIDOCAINE HCL (CARDIAC) PF 100 MG/5ML IV SOSY
PREFILLED_SYRINGE | INTRAVENOUS | Status: DC | PRN
  Administered 2021-09-15: 100 mg via INTRAVENOUS

## 2021-09-15 MED ORDER — SODIUM CHLORIDE 0.9 % IV SOLN: INTRAVENOUS | Status: DC | PRN

## 2021-09-15 MED ORDER — IOHEXOL 350 MG/ML SOLN
75.0000 mL | Freq: Once | INTRAVENOUS | Status: AC | PRN
  Administered 2021-09-15: 75 mL via INTRAVENOUS

## 2021-09-15 MED ORDER — PANTOPRAZOLE SODIUM 40 MG PO TBEC
40.0000 mg | DELAYED_RELEASE_TABLET | Freq: Two times a day (BID) | ORAL | Status: DC
  Administered 2021-09-15 – 2021-09-16 (×2): 40 mg via ORAL
  Filled 2021-09-15 (×2): qty 1

## 2021-09-15 MED ORDER — PROPOFOL 500 MG/50ML IV EMUL
INTRAVENOUS | Status: AC
  Filled 2021-09-15: qty 50

## 2021-09-15 MED ORDER — POTASSIUM CHLORIDE CRYS ER 20 MEQ PO TBCR
40.0000 meq | EXTENDED_RELEASE_TABLET | ORAL | Status: AC
  Administered 2021-09-15: 20:00:00 40 meq via ORAL
  Filled 2021-09-15: qty 2

## 2021-09-15 MED ORDER — ONDANSETRON HCL 4 MG/2ML IJ SOLN
4.0000 mg | Freq: Three times a day (TID) | INTRAMUSCULAR | Status: DC | PRN
  Administered 2021-09-16: 08:00:00 4 mg via INTRAVENOUS
  Filled 2021-09-15: qty 2

## 2021-09-15 MED ORDER — ALPRAZOLAM 1 MG PO TABS
1.0000 mg | ORAL_TABLET | Freq: Three times a day (TID) | ORAL | Status: DC | PRN
  Administered 2021-09-15: 20:00:00 1 mg via ORAL
  Filled 2021-09-15: qty 1

## 2021-09-15 MED ORDER — PROPOFOL 10 MG/ML IV BOLUS
INTRAVENOUS | Status: DC | PRN
  Administered 2021-09-15: 80 mg via INTRAVENOUS

## 2021-09-15 MED ORDER — DM-GUAIFENESIN ER 30-600 MG PO TB12
1.0000 | ORAL_TABLET | Freq: Two times a day (BID) | ORAL | Status: DC | PRN
  Administered 2021-09-15: 20:00:00 1 via ORAL
  Filled 2021-09-15 (×2): qty 1

## 2021-09-15 MED ORDER — PREDNISONE 20 MG PO TABS
40.0000 mg | ORAL_TABLET | Freq: Every day | ORAL | Status: DC
  Administered 2021-09-16: 40 mg via ORAL
  Filled 2021-09-15: qty 2

## 2021-09-15 MED ORDER — POTASSIUM CHLORIDE 10 MEQ/100ML IV SOLN
10.0000 meq | INTRAVENOUS | Status: AC
Start: 2021-09-15 — End: 2021-09-15
  Administered 2021-09-15: 10 meq via INTRAVENOUS
  Filled 2021-09-15: qty 100

## 2021-09-15 MED ORDER — FLUOXETINE HCL 10 MG PO CAPS
10.0000 mg | ORAL_CAPSULE | Freq: Every day | ORAL | Status: DC
  Administered 2021-09-15 – 2021-09-16 (×2): 10 mg via ORAL
  Filled 2021-09-15 (×2): qty 1

## 2021-09-15 MED ORDER — PANTOPRAZOLE SODIUM 40 MG IV SOLR
40.0000 mg | Freq: Once | INTRAVENOUS | Status: AC
  Administered 2021-09-15: 40 mg via INTRAVENOUS
  Filled 2021-09-15: qty 40

## 2021-09-15 MED ORDER — ONDANSETRON HCL 4 MG/2ML IJ SOLN
4.0000 mg | Freq: Once | INTRAMUSCULAR | Status: AC
  Administered 2021-09-15: 4 mg via INTRAVENOUS
  Filled 2021-09-15: qty 2

## 2021-09-15 MED ORDER — ALBUTEROL SULFATE HFA 108 (90 BASE) MCG/ACT IN AERS: 2.0000 | INHALATION_SPRAY | RESPIRATORY_TRACT | Status: DC | PRN

## 2021-09-15 NOTE — ED Provider Notes (Signed)
[facility] Emergency Department Provider Note  ____________________________________________   Event Date/Time   First MD Initiated Contact with Patient 09/15/21 1216     (approximate)  I have reviewed the triage vital signs and the nursing notes.   HISTORY  Chief Complaint Abdominal Pain    HPI Kim Mayo is a 45 y.o. female with anxiety who comes in with concerns for abdominal pain.  Patient had COVID in January and since then she is lost her appetite with a lot of nausea and lost 26 pounds.  Patient was reportedly referred by GI for further work-up here in the emergency room due to weightloss.  Patient states that the only thing she can drink is tea and is only from a specific fast food restaurant.  She states even if she makes her own TV she cannot tolerate it.  She states that she develops a lot of pain in her upper abdomen that is constant, severe, worse after eating, not better with her PPI, Carafate, Bentyl that have all been prescribed to her.  She reports that she is already had her gallbladder out as well as her appendix.  She states that she was recently diagnosed with bronchitis and had negative COVID test but states that she did not take the prednisone that was prescribed to her and that those symptoms are getting better and that she is really only here for the abdominal pain and the not been able to eat.  She is supposed to have a endoscopy done on 10/3 to evaluate for ulcers.         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 Authorizing Provider  ALPRAZolam Prudy Feeler) 1 MG tablet Take 1 mg by mouth 3 (three) times daily as needed. 09/04/21   [provider]  ciprofloxacin (CIPRO) 500 MG tablet  08/22/15   [provider]  dicyclomine (BENTYL) 10 MG capsule Take 10-20 mg by mouth 4 (four) times daily as needed. 06/27/21   [provider]  HYDROmorphone (DILAUDID) 2 MG tablet Take 1 tablet (2 mg total) by mouth every 4 (four) hours as needed for severe pain. Patient not taking: No sig reported 08/09/15   Ricarda Frame, MD  lansoprazole (PREVACID) 30 MG capsule Take 30 mg by mouth 2 (two) times daily. 05/03/21   [provider]  ondansetron (ZOFRAN ODT) 8 MG disintegrating tablet Take 1 tablet (8 mg total) by mouth every 8 (eight) hours as needed for nausea or vomiting. 09/13/21   Eusebio Friendly B, PA-C  pantoprazole (PROTONIX) 40 MG tablet Take 40 mg by mouth 2 (two) times daily. 08/16/21   [provider]  PARoxetine (PAXIL) 20 MG tablet Take by mouth.    [provider]  predniSONE (DELTASONE) 10 MG tablet Take 4 tablets (40 mg total) by mouth daily with breakfast for 5 days. 09/13/21 09/18/21  Shirlee Latch, PA-C  promethazine-dextromethorphan (PROMETHAZINE-DM) 6.25-15 MG/5ML syrup Take 5 mLs by mouth 4 (four) times daily as needed for cough. 09/13/21   Eusebio Friendly B, PA-C  sucralfate (CARAFATE) 1 g tablet Take 1 g by mouth 4 (four) times daily. 06/01/21   [provider]    Allergies Hydrocodone-acetaminophen, Oxycodone-acetaminophen, Cephalexin, Oxycodone-acetaminophen, and Codeine  Family History  Problem Relation Age of Onset   Cancer Mother    Heart disease Father    Hypertension Father    Thyroid disease Father     Social History Social History   Tobacco Use   Smoking status: Never   Smokeless tobacco:  Never  Vaping Use   Vaping Use: Never used  Substance Use Topics   Alcohol use: Not Currently    Comment: occasionally   Drug use: No      Review of Systems Constitutional: No fever/chills Eyes: No visual changes. ENT: No sore throat. Cardiovascular: Denies chest pain. Respiratory: Denies shortness of breath. Gastrointestinal: Abdominal pain, nausea, vomiting Genitourinary: Negative for dysuria. Musculoskeletal: Negative for back pain. Skin: Negative for rash. Neurological: Negative for headaches, focal weakness or numbness. All other ROS negative ____________________________________________   PHYSICAL EXAM:  VITAL SIGNS: ED Triage Vitals  Enc Vitals Group     BP 09/15/21 1206 (!) 125/105     Pulse Rate 09/15/21 1205 87     Resp 09/15/21 1205 16     Temp 09/15/21 1205 98.5 F (36.9 C)     Temp Source 09/15/21 1205 Oral     SpO2 09/15/21 1205 97 %     Weight 09/15/21 1205 164 lb (74.4 kg)     Height 09/15/21 1205 5\' 5"  (1.651 m)     Head Circumference --      Peak Flow --      Pain Score 09/15/21 1205 0     Pain Loc --      Pain Edu? --      Excl. in GC? --     Constitutional: Alert and oriented. Well appearing and in no acute distress. Eyes: Conjunctivae are normal. EOMI. Head: Atraumatic. Nose: No congestion/rhinnorhea. Mouth/Throat: Mucous membranes are moist.   Neck: No stridor. Trachea Midline. FROM Cardiovascular: Normal rate, regular rhythm. Grossly normal heart sounds.  Good peripheral circulation. Respiratory: Normal respiratory effort.  No retractions. Lungs CTAB. Gastrointestinal: Tender in the upper abdomen.  No distention. No abdominal bruits.  Musculoskeletal: No lower extremity tenderness nor edema.  No joint effusions. Neurologic:  Normal speech and language. No gross focal neurologic deficits are appreciated.  Skin:  Skin is warm, dry and intact. No rash noted. Psychiatric: Mood and affect are normal. Speech and behavior are normal. GU:  Deferred   ____________________________________________   LABS (all labs ordered are listed, but only abnormal results are displayed)  Labs Reviewed  CBC - Abnormal; Notable for the following components:      Result Value   Hemoglobin 15.5 (*)    MCHC 36.5 (*)    All other components within normal limits  LIPASE, BLOOD  COMPREHENSIVE METABOLIC PANEL  URINALYSIS, COMPLETE (UACMP) WITH MICROSCOPIC  TSH  T4, FREE  MAGNESIUM  POC URINE PREG, ED   ____________________________________________ RADIOLOGY   Official radiology report(s): CT ABDOMEN PELVIS W CONTRAST  Result Date: 09/15/2021 CLINICAL DATA:  Abdominal distension EXAM: CT ABDOMEN AND PELVIS WITH CONTRAST TECHNIQUE: Multidetector CT imaging of the abdomen and pelvis was performed using the standard protocol following bolus administration of intravenous contrast. CONTRAST:  62mL OMNIPAQUE IOHEXOL 350 MG/ML SOLN  COMPARISON:  CT June 22, 2009. FINDINGS: Lower chest: Mild linear atelectasis in both lung bases. Otherwise, clear visualized lung bases. Hepatobiliary: No focal liver abnormality is seen. Status post cholecystectomy. No biliary dilatation. Mild focal fat along the falciform ligament. Pancreas: Unremarkable. No pancreatic ductal dilatation or surrounding inflammatory changes. Spleen: Normal in size without focal abnormality. Adrenals/Urinary Tract: Adrenal glands are unremarkable. Kidneys are normal, without renal calculi, focal lesion, or hydronephrosis. Bladder is unremarkable. Stomach/Bowel: Stomach is within normal limits. Prior appendectomy. No evidence of bowel wall thickening, distention, or inflammatory changes. Vascular/Lymphatic: No significant vascular findings are present. No enlarged abdominal or pelvic lymph nodes. Reproductive: Probable uterine fibroid. Otherwise, uterus and bilateral adnexa are unremarkable. Other: Small umbilical hernia.  No abdominopelvic ascites. Musculoskeletal: No fracture is seen.  IMPRESSION: 1. No acute abnormality or findings to explain the patient's reported abdominal distension. 2. Prior cholecystectomy and appendectomy. Electronically Signed   By: Feliberto Harts M.D.   On: 09/15/2021 14:32    ____________________________________________   PROCEDURES  Procedure(s) performed (including Critical Care):  Procedures   ____________________________________________   INITIAL IMPRESSION / ASSESSMENT AND PLAN / ED COURSE  FREYA ZOBRIST was evaluated in Emergency Department on 09/15/2021 for the symptoms described in the history of present illness. She was evaluated in the context of the global COVID-19 pandemic, which necessitated consideration that the patient might be at risk for infection with the SARS-CoV-2 virus that causes COVID-19. Institutional protocols and algorithms that pertain to the evaluation of patients at risk for COVID-19 are in a state of rapid change based on information released by regulatory bodies including the CDC and federal and state organizations. These policies and algorithms were followed during the patient's care in the ED.    Patient comes in with continued upper abdominal pain and weight loss.  We will get CT scan to make sure evidence of pancreatic mass, liver cancer or other acute pathology given the significant weight loss.  Labs ordered to evaluate for Electra abnormalities, AKI, pregnancy.  Patient was given 1 dose of IV Zofran, Protonix and IV fluids while pending results  Labs are reassuring.  Potassium slightly low and kidney function slightly elevated getting some fluids and potassium.  CT imaging was negative.  Patient's been tolerating some liquids.  Dr. Servando Snare came  byand evaluated patient again they can do the endoscopy today.  Will admit to the hospital           ____________________________________________   FINAL CLINICAL IMPRESSION(S) / ED DIAGNOSES   Final diagnoses:  Hypokalemia  Epigastric pain       MEDICATIONS GIVEN DURING THIS VISIT:  Medications  potassium chloride 10 mEq in 100 mL IVPB ( Intravenous Rate/Dose Change 09/15/21 1542)  sodium chloride 0.9 % bolus 1,000 mL (1,000 mLs Intravenous New Bag/Given 09/15/21 1308)  ondansetron (ZOFRAN) injection 4 mg (4 mg Intravenous Given 09/15/21 1308)  pantoprazole (PROTONIX) injection 40 mg (40 mg Intravenous Given 09/15/21 1327)  iohexol (OMNIPAQUE) 350 MG/ML injection 75 mL (75 mLs Intravenous Contrast Given 09/15/21 1335)     ED Discharge Orders     None        Note:  This document was prepared using Dragon voice recognition software and may include unintentional dictation errors.    Concha Se, MD 09/15/21 [phone]

## 2021-09-15 NOTE — Anesthesia Preprocedure Evaluation (Addendum)
Anesthesia Evaluation  Patient identified by MRN, date of birth, ID band Patient awake    Reviewed: Allergy & Precautions, H&P , NPO status , Patient's Chart, lab work & pertinent test results, reviewed documented beta blocker date and time   History of Anesthesia Complications Negative for: history of anesthetic complications  Airway Mallampati: I  TM Distance: >3 FB Neck ROM: full    Dental no notable dental hx. (+) Teeth Intact   Pulmonary Recent URI , Residual Cough,  Reports recent bronchitis   Pulmonary exam normal breath sounds clear to auscultation       Cardiovascular Exercise Tolerance: Good negative cardio ROS Normal cardiovascular exam Rhythm:regular Rate:Normal     Neuro/Psych  Headaches, PSYCHIATRIC DISORDERS (Anxiety) Anxiety    GI/Hepatic Neg liver ROS, epigastric pain   Endo/Other  negative endocrine ROS  Renal/GU negative Renal ROS  negative genitourinary   Musculoskeletal   Abdominal Normal abdominal exam  (+)   Peds  Hematology negative hematology ROS (+)   Anesthesia Other Findings Patient had COVID in January and since then she lost her appetite with a lot of nausea and lost 26 pounds due to intolerance of food.  Electrolytes reviewed Past Medical History:   Anxiety                                                      Reproductive/Obstetrics negative OB ROS                            Anesthesia Physical  Anesthesia Plan  ASA: 2  Anesthesia Plan: General   Post-op Pain Management:    Induction: Intravenous  PONV Risk Score and Plan: TIVA and Treatment may vary due to age or medical condition  Airway Management Planned: Natural Airway and Nasal Cannula  Additional Equipment:   Intra-op Plan:   Post-operative Plan:   Informed Consent: I have reviewed the patients History and Physical, chart, labs and discussed the procedure including the risks,  benefits and alternatives for the proposed anesthesia with the patient or authorized representative who has indicated his/her understanding and acceptance.     Dental advisory given  Plan Discussed with: Anesthesiologist, CRNA and Surgeon  Anesthesia Plan Comments:        Anesthesia Quick Evaluation

## 2021-09-15 NOTE — Transfer of Care (Signed)
Immediate Anesthesia Transfer of Care Note  Patient: Kim Mayo  Procedure(s) Performed: ESOPHAGOGASTRODUODENOSCOPY (EGD) WITH PROPOFOL  Patient Location: Endoscopy Unit  Anesthesia Type:General  Level of Consciousness: drowsy  Airway & Oxygen Therapy: Patient Spontanous Breathing  Post-op Assessment: Report given to RN and Post -op Vital signs reviewed and stable  Post vital signs: Reviewed and stable  Last Vitals:  Vitals Value Taken Time  BP 97/73 09/15/21 1644  Temp 36.2 C 09/15/21 1643  Pulse 65 09/15/21 1646  Resp 29 09/15/21 1646  SpO2 93 % 09/15/21 1646  Vitals shown include unvalidated device data.  Last Pain:  Vitals:   09/15/21 1643  TempSrc: Temporal  PainSc: Asleep         Complications: No notable events documented.

## 2021-09-15 NOTE — Consult Note (Signed)
Kim Minium, MD [facility]  [address], [address] [place], [place] [address] Phone: [phone] Fax : [phone]  Consultation  Referring Provider:     Dr. Fuller Mayo Primary Care Physician:  Kim Kern, MD Primary Gastroenterologist:  Dr. Timothy Mayo         Reason for Consultation:     Abdominal pain  Date of Admission:  09/15/2021 Date of Consultation:  09/15/2021         HPI:   Kim Mayo is a 45 y.o. female who came to emergency room after calling her gastroenterologist office and was told to go to the ER.  The patient has a upper endoscopy scheduled for early October but states that she cannot wait so she was sent to the ER.  The patient had reported that she had lost between 26 and 30 pounds and has lost her appetite since January when she had COVID.  The patient also reports that her pain is worse after she eats.  It has been constant and severe and has not been better with a PPI, Carafate and dicyclomine that have been prescribed for her for this.  She was in urgent care 2 days ago and had a negative COVID test.  In urgent care she was seen there for a cough and vomiting and was reporting fatigue for 4 days with body aches and congestion.  The patient was also taking Zofran that was prescribed by her outpatient gastroenterologist.  The patient reports that she has had black stools but states that it has been black since she started taking Pepto-Bismol.  Past Medical History:  Diagnosis Date   Anxiety    Bronchitis    Depression     Past Surgical History:  Procedure Laterality Date   APPENDECTOMY     BREAST SURGERY     CHOLECYSTECTOMY N/A 08/07/2015   Procedure: LAPAROSCOPIC CHOLECYSTECTOMY WITH INTRAOPERATIVE CHOLANGIOGRAM;  Surgeon: Kim Rouge III, MD;  Location: [facility] ORS;  Service: General;  Laterality: N/A;    Prior to Admission medications   Medication Sig Start Date End Date Taking? Authorizing Provider  ALPRAZolam Prudy Feeler) 1 MG tablet Take 1 mg by mouth 3 (three)  times daily as needed. 09/04/21  Yes [provider]  dicyclomine (BENTYL) 10 MG capsule Take 10-20 mg by mouth 4 (four) times daily as needed. 06/27/21  Yes [provider]  FLUoxetine (PROZAC) 10 MG capsule Take 10 mg by mouth daily. 08/24/21  Yes [provider]  ondansetron (ZOFRAN ODT) 8 MG disintegrating tablet Take 1 tablet (8 mg total) by mouth every 8 (eight) hours as needed for nausea or vomiting. 09/13/21  Yes Kim Friendly B, PA-C  pantoprazole (PROTONIX) 40 MG tablet Take 40 mg by mouth 2 (two) times daily. 08/16/21  Yes [provider]  PARoxetine (PAXIL) 20 MG tablet Take by mouth.   Yes [provider]  promethazine-dextromethorphan (PROMETHAZINE-DM) 6.25-15 MG/5ML syrup Take 5 mLs by mouth 4 (four) times daily as needed for cough. 09/13/21  Yes Kim Friendly B, PA-C  sucralfate (CARAFATE) 1 g tablet Take 1 g by mouth 4 (four) times daily. 06/01/21  Yes [provider]  ciprofloxacin (CIPRO) 500 MG tablet  08/22/15   [provider]  HYDROmorphone (DILAUDID) 2 MG tablet Take 1 tablet (2 mg total) by mouth every 4 (four) hours as needed for severe pain. Patient not taking: No sig reported 08/09/15   Kim Frame, MD  lansoprazole (PREVACID) 30 MG capsule Take 30  mg by mouth 2 (two) times daily. 05/03/21   [provider]  predniSONE (DELTASONE) 10 MG tablet Take 4 tablets (40 mg total) by mouth daily with breakfast for 5 days. 09/13/21 09/18/21  Shirlee Latch, PA-C    Family History  Problem Relation Age of Onset   Cancer Mother    Heart disease Father    Hypertension Father    Thyroid disease Father      Social History   Tobacco Use   Smoking status: Never   Smokeless tobacco: Never  Vaping Use   Vaping Use: Never used  Substance Use Topics   Alcohol use: Not Currently   Drug use: No    Allergies as of 09/15/2021 - Review Complete 09/15/2021  Allergen Reaction Noted   Hydrocodone-acetaminophen Nausea  And Vomiting 09/13/2021   Oxycodone-acetaminophen Rash 11/01/2017   Cephalexin     Oxycodone-acetaminophen     Codeine Rash 01/05/2020    Review of Systems:    All systems reviewed and negative except where noted in HPI.   Physical Exam:  Vital signs in last 24 hours: Temp:  [97.2 F (36.2 C)-98.5 F (36.9 C)] 97.2 F (36.2 C) (09/23 1643) Pulse Rate:  [54-87] 54 (09/23 1618) Resp:  [16-23] 23 (09/23 1653) BP: (97-158)/(73-105) 97/73 (09/23 1643) SpO2:  [97 %-100 %] 100 % (09/23 1618) Weight:  [74.4 kg] 74.4 kg (09/23 1618)   General:   Pleasant, cooperative in NAD Head:  Normocephalic and atraumatic. Eyes:   No icterus.   Conjunctiva pink. PERRLA. Ears:  Normal auditory acuity. Neck:  Supple; no masses or thyroidomegaly Lungs: Respirations even and unlabored. Lungs clear to auscultation bilaterally.   No wheezes, crackles, or rhonchi.  Heart:  Regular rate and rhythm;  Without murmur, clicks, rubs or gallops Abdomen:  Soft, nondistended, epigastric tenderness. Normal bowel sounds. No appreciable masses or hepatomegaly.  No rebound or guarding.  Rectal:  Not performed. Msk:  Symmetrical without gross deformities.    Extremities:  Without edema, cyanosis or clubbing. Neurologic:  Alert and oriented x3;  grossly normal neurologically. Skin:  Intact without significant lesions or rashes. Cervical Nodes:  No significant cervical adenopathy. Psych:  Alert and cooperative. Normal affect.  LAB RESULTS: Recent Labs    09/15/21 1208  WBC 4.1  HGB 15.5*  HCT 42.5  PLT 211   BMET Recent Labs    09/15/21 1208  NA 136  K 3.2*  CL 102  CO2 26  GLUCOSE 77  BUN 10  CREATININE 1.05*  CALCIUM 8.9   LFT Recent Labs    09/15/21 1208  PROT 8.0  ALBUMIN 4.3  AST 20  ALT 14  ALKPHOS 68  BILITOT 0.8   PT/INR No results for input(s): LABPROT, INR in the last 72 hours.  STUDIES: DG Chest 2 View  Result Date: 09/13/2021 CLINICAL DATA:  Cough and congestion. Shortness  of breath. Symptoms for 4 days. EXAM: CHEST - 2 VIEW COMPARISON:  None. FINDINGS: The cardiomediastinal contours are normal. Mild bronchial thickening. Pulmonary vasculature is normal. No consolidation, pleural effusion, or pneumothorax. No acute osseous abnormalities are seen. IMPRESSION: Mild bronchial thickening which can be seen with bronchitis or asthma. No pneumonia. Electronically Signed   By: Narda Rutherford M.D.   On: 09/13/2021 19:02   CT ABDOMEN PELVIS W CONTRAST  Result Date: 09/15/2021 CLINICAL DATA:  Abdominal distension EXAM: CT ABDOMEN AND PELVIS WITH CONTRAST TECHNIQUE: Multidetector CT imaging of the abdomen and pelvis was performed using the standard protocol  following bolus administration of intravenous contrast. CONTRAST:  82mL OMNIPAQUE IOHEXOL 350 MG/ML SOLN COMPARISON:  CT June 22, 2009. FINDINGS: Lower chest: Mild linear atelectasis in both lung bases. Otherwise, clear visualized lung bases. Hepatobiliary: No focal liver abnormality is seen. Status post cholecystectomy. No biliary dilatation. Mild focal fat along the falciform ligament. Pancreas: Unremarkable. No pancreatic ductal dilatation or surrounding inflammatory changes. Spleen: Normal in size without focal abnormality. Adrenals/Urinary Tract: Adrenal glands are unremarkable. Kidneys are normal, without renal calculi, focal lesion, or hydronephrosis. Bladder is unremarkable. Stomach/Bowel: Stomach is within normal limits. Prior appendectomy. No evidence of bowel wall thickening, distention, or inflammatory changes. Vascular/Lymphatic: No significant vascular findings are present. No enlarged abdominal or pelvic lymph nodes. Reproductive: Probable uterine fibroid. Otherwise, uterus and bilateral adnexa are unremarkable. Other: Small umbilical hernia.  No abdominopelvic ascites. Musculoskeletal: No fracture is seen. IMPRESSION: 1. No acute abnormality or findings to explain the patient's reported abdominal distension. 2. Prior  cholecystectomy and appendectomy. Electronically Signed   By: Feliberto Harts M.D.   On: 09/15/2021 14:32      Impression / Mayo:   Assessment: Principal Problem:   Epigastric abdominal pain Active Problems:   HEADACHE, TENSION   Nausea vomiting and diarrhea   GERD (gastroesophageal reflux disease)   Depression with anxiety   Hypokalemia   Gastritis without bleeding   ACIRE TANG is a 45 y.o. y/o female with abdominal pain and a 26 pound weight loss with no help from a PPI Carafate Bentyl and she has been taking Zofran.  The patient reports that she is only able to drink tea and only from specific places that make tea.  The patient has also been taking Pepto-Bismol without relief of her symptoms.  Patient was seen by her primary for consultation back on August 24 and was set up for a upper endoscopy on October 3 but called their office today and was told to go to the ER.  Mayo:  This patient is having nausea vomiting with abdominal discomfort and reports a 26 pound weight loss.  The patient will be brought up to the endoscopy unit and undergo an EGD today.  The patient has been explained the risks and benefits and alternatives.  The patient agrees to proceeding with an upper endoscopy today.  This will be done to rule out any peptic ulcer disease strictures or masses.  It will also be used to evaluate for any gastritis.  The patient has been explained the Mayo and agrees with it.  Thank you for involving me in the care of this patient.      LOS: 0 days   Kim Minium, MD, [facility] 09/15/2021, 4:58 PM,  Pager [phone] 7am-5pm  Check AMION for 5pm -7am coverage and on weekends   Note: This dictation was prepared with Dragon dictation along with smaller phrase technology. Any transcriptional errors that result from this process are unintentional.

## 2021-09-15 NOTE — Anesthesia Postprocedure Evaluation (Signed)
Anesthesia Post Note  Patient: Kim Mayo  Procedure(s) Performed: ESOPHAGOGASTRODUODENOSCOPY (EGD) WITH PROPOFOL  Patient location during evaluation: Endoscopy Anesthesia Type: General Level of consciousness: awake and alert Pain management: pain level controlled Vital Signs Assessment: post-procedure vital signs reviewed and stable Respiratory status: spontaneous breathing, nonlabored ventilation and respiratory function stable Cardiovascular status: blood pressure returned to baseline and stable Postop Assessment: no apparent nausea or vomiting Anesthetic complications: no   No notable events documented.   Last Vitals:  Vitals:   09/15/21 1643 09/15/21 1653  BP: 97/73   Pulse:    Resp: 20 (!) 23  Temp: (!) 36.2 C   SpO2:      Last Pain:  Vitals:   09/15/21 1653  TempSrc:   PainSc: Asleep                 Foye Deer

## 2021-09-15 NOTE — Op Note (Signed)
[facility] Patient Name: Kim Mayo Procedure Date: 09/15/2021 3:55 PM MRN: [id number] Account #: [id number] Date of Birth: 08-23-1976 Admit Type: Outpatient Age: 45 Room: [facility] ENDO ROOM 4 Gender: Female Note Status: Finalized Instrument Name: Upper Endoscope [id number] Procedure:             Upper GI endoscopy Indications:           Epigastric abdominal pain Providers:             Midge Minium MD, MD Referring MD:          No Local Md, MD (Referring MD) Medicines:             Propofol per Anesthesia Complications:         No immediate complications. Procedure:             Pre-Anesthesia Assessment:                        - Prior to the procedure, a History and Physical was                         performed, and patient medications and allergies were                         reviewed. The patient's tolerance of previous                         anesthesia was also reviewed. The risks and benefits                         of the procedure and the sedation options and risks                         were discussed with the patient. All questions were                         answered, and informed consent was obtained. Prior                         Anticoagulants: The patient has taken no previous                         anticoagulant or antiplatelet agents. ASA Grade                         Assessment: II - A patient with mild systemic disease.                         After reviewing the risks and benefits, the patient                         was deemed in satisfactory condition to undergo the                         procedure.                        After obtaining informed consent, the endoscope was  passed under direct vision. Throughout the procedure,                         the patient's blood pressure, pulse, and oxygen                         saturations were monitored continuously. The Endoscope                         was  introduced through the mouth, and advanced to the                         second part of duodenum. The upper GI endoscopy was                         accomplished without difficulty. The patient tolerated                         the procedure well. Findings:      The examined esophagus was normal.      Localized minimal inflammation characterized by erythema was found in       the gastric antrum. Biopsies were taken with a cold forceps for       histology.      The examined duodenum was normal. Impression:            - Normal esophagus.                        - Gastritis. Biopsied.                        - Normal examined duodenum. Recommendation:        - Return patient to hospital ward for ongoing care.                        - Resume previous diet.                        - Continue present medications. Procedure Code(s):     --- Professional ---                        [phone], Esophagogastroduodenoscopy, flexible,                         transoral; with biopsy, single or multiple Diagnosis Code(s):     --- Professional ---                        R10.13, Epigastric pain                        K29.70, Gastritis, unspecified, without bleeding CPT copyright 2019 American Medical Association. All rights reserved. The codes documented in this report are preliminary and upon coder review may  be revised to meet current compliance requirements. Midge Minium MD, MD 09/15/2021 4:40:51 PM This report has been signed electronically. Number of Addenda: 0 Note Initiated On: 09/15/2021 3:55 PM Estimated Blood Loss:  Estimated blood loss: none.      [facility]

## 2021-09-15 NOTE — ED Triage Notes (Signed)
Pt states since having covid in January she has been having loss of appetite with nausea and has lost a total of 26lbs, in the past month has lost 10lb and the GI referred her to the ED for a work up.

## 2021-09-15 NOTE — ED Notes (Signed)
Rate change of potassium due to patient stating it was burning upon administration.

## 2021-09-15 NOTE — H&P (Signed)
History and Physical    Kim Mayo TDD:[id number] DOB: March 26, 1976 DOA: 09/15/2021  Referring MD/NP/PA:   PCP: Dortha Kern, MD   Patient coming from:  The patient is coming from home.  At baseline, pt is independent for most of ADL.        Chief Complaint: Nausea, vomiting, diarrhea and epigastric abdominal pain  HPI: Kim Mayo is a 45 y.o. female with medical history significant of GERD, depression, anxiety, s/p for cholecystectomy, tension headache, who presents with nausea, vomiting, diarrhea and epigastric abdominal pain.  Patient states that ever since she had COVID-19 infection in January, she has been having poor appetite and decreased oral intake.  Recently patient has intermittent nausea, vomiting and diarrhea.  Patient has nonbilious nonbloody vomiting several times each day.  Patient also has watery diarrhea, at least 4 times since yesterday.  She has lost 26 pounds recently.  She complains of epigastric abdominal pain, which is constant, sharp, severe, nonradiating.  Patient states that because of headache, she has been taking a lot of ibuprofen recently.  She states that she had bronchitis recently, which has improved. She is taking prednisone currently.  Still has mild dry cough, no shortness breath, chest pain fever or chills.  Patient denies symptoms of UTI.  No dark stool or rectal bleeding. She is supposed to have a endoscopy done on 10/3   ED Course: pt was found to have WBC 4.1, hemoglobin 15.5, negative pregnancy test, urinalysis is positive but with squamous cell contamination (patient does not have symptoms of UTI), negative COVID PCR, GFR >60, potassium 3.2, magnesium 1.8, temperature normal, blood pressure 125/105, heart rate 87, RR 16, oxygen saturation 97% on room air.  CT abdomen/pelvis is negative for acute intra-abdominal issues.  Patient is placed on MedSurg bed for observation, Dr. Servando Snare of GI is consulted for EGD.  Review of Systems:   General: no  fevers, chills, has weight loss, has poor appetite, has fatigue HEENT: no blurry vision, hearing changes or sore throat Respiratory: no dyspnea, has coughing, no wheezing CV: no chest pain, no palpitations GI: has nausea, vomiting, abdominal pain, diarrhea, no constipation GU: no dysuria, burning on urination, increased urinary frequency, hematuria  Ext: no leg edema Neuro: no unilateral weakness, numbness, or tingling, no vision change or hearing loss Skin: no rash, no skin tear. MSK: No muscle spasm, no deformity, no limitation of range of movement in spin Heme: No easy bruising.  Travel history: No recent long distant travel.  Allergy:  Allergies  Allergen Reactions   Hydrocodone-Acetaminophen Nausea And Vomiting   Oxycodone-Acetaminophen Rash   Cephalexin     REACTION: Itchy, eyes swelling.   Oxycodone-Acetaminophen     REACTION: Nausea, vomiting and rash   Codeine Rash    REACTION: Nausea, vomiting and rash    Past Medical History:  Diagnosis Date   Anxiety    Bronchitis    Depression     Past Surgical History:  Procedure Laterality Date   APPENDECTOMY     BREAST SURGERY     CHOLECYSTECTOMY N/A 08/07/2015   Procedure: LAPAROSCOPIC CHOLECYSTECTOMY WITH INTRAOPERATIVE CHOLANGIOGRAM;  Surgeon: Tiney Rouge III, MD;  Location: [facility] ORS;  Service: General;  Laterality: N/A;    Social History:  reports that she has never smoked. She has never used smokeless tobacco. She reports that she does not currently use alcohol. She reports that she does not use drugs.  Family History:  Family History  Problem Relation Age of Onset  Cancer Mother    Heart disease Father    Hypertension Father    Thyroid disease Father      Prior to Admission medications   Medication Sig Start Date End Date Taking? Authorizing Provider  ALPRAZolam Prudy Feeler) 1 MG tablet Take 1 mg by mouth 3 (three) times daily as needed. 09/04/21   [provider]  ciprofloxacin (CIPRO) 500 MG tablet   08/22/15   [provider]  dicyclomine (BENTYL) 10 MG capsule Take 10-20 mg by mouth 4 (four) times daily as needed. 06/27/21   [provider]  FLUoxetine (PROZAC) 10 MG capsule Take 10 mg by mouth daily. 08/24/21   [provider]  HYDROmorphone (DILAUDID) 2 MG tablet Take 1 tablet (2 mg total) by mouth every 4 (four) hours as needed for severe pain. Patient not taking: No sig reported 08/09/15   Ricarda Frame, MD  lansoprazole (PREVACID) 30 MG capsule Take 30 mg by mouth 2 (two) times daily. 05/03/21   [provider]  ondansetron (ZOFRAN ODT) 8 MG disintegrating tablet Take 1 tablet (8 mg total) by mouth every 8 (eight) hours as needed for nausea or vomiting. 09/13/21   Eusebio Friendly B, PA-C  pantoprazole (PROTONIX) 40 MG tablet Take 40 mg by mouth 2 (two) times daily. 08/16/21   [provider]  PARoxetine (PAXIL) 20 MG tablet Take by mouth.    [provider]  predniSONE (DELTASONE) 10 MG tablet Take 4 tablets (40 mg total) by mouth daily with breakfast for 5 days. 09/13/21 09/18/21  Shirlee Latch, PA-C  promethazine-dextromethorphan (PROMETHAZINE-DM) 6.25-15 MG/5ML syrup Take 5 mLs by mouth 4 (four) times daily as needed for cough. 09/13/21   Eusebio Friendly B, PA-C  sucralfate (CARAFATE) 1 g tablet Take 1 g by mouth 4 (four) times daily. 06/01/21   [provider]    Physical Exam: Vitals:   09/15/21 1643 09/15/21 1653 09/15/21 1713 09/15/21 1738  BP: 97/73  93/63 124/64  Pulse:    (!) 56  Resp: 20 (!) 23 14 18   Temp: (!) 97.2 F (36.2 C)   98 F (36.7 C)  TempSrc: Temporal     SpO2:    100%  Weight:      Height:       General: Not in acute distress HEENT:       Eyes: PERRL, EOMI, no scleral icterus.       ENT: No discharge from the ears and nose, no pharynx injection, no tonsillar enlargement.        Neck: No JVD, no bruit, no mass felt. Heme: No neck lymph node enlargement. Cardiac: S1/S2, RRR, No murmurs, No gallops or  rubs. Respiratory: No rales, wheezing, rhonchi or rubs. GI: Soft, nondistended, has tenderness in epigastric area, no rebound pain, no organomegaly, BS present. GU: No hematuria Ext: No pitting leg edema bilaterally. 1+DP/PT pulse bilaterally. Musculoskeletal: No joint deformities, No joint redness or warmth, no limitation of ROM in spin. Skin: No rashes.  Neuro: Alert, oriented X3, cranial nerves II-XII grossly intact, moves all extremities normally.  Psych: Patient is not psychotic, no suicidal or hemocidal ideation.  Labs on Admission: I have personally reviewed following labs and imaging studies  CBC: Recent Labs  Lab 09/15/21 1208  WBC 4.1  HGB 15.5*  HCT 42.5  MCV 89.9  PLT 211   Basic Metabolic Panel: Recent Labs  Lab 09/15/21 1208 09/15/21 1209  NA 136  --   K 3.2*  --   CL  102  --   CO2 26  --   GLUCOSE 77  --   BUN 10  --   CREATININE 1.05*  --   CALCIUM 8.9  --   MG  --  1.8   GFR: Estimated Creatinine Clearance: 69.1 mL/min (A) (by C-G formula based on SCr of 1.05 mg/dL (H)). Liver Function Tests: Recent Labs  Lab 09/15/21 1208  AST 20  ALT 14  ALKPHOS 68  BILITOT 0.8  PROT 8.0  ALBUMIN 4.3   Recent Labs  Lab 09/15/21 1208  LIPASE 30   No results for input(s): AMMONIA in the last 168 hours. Coagulation Profile: No results for input(s): INR, PROTIME in the last 168 hours. Cardiac Enzymes: No results for input(s): CKTOTAL, CKMB, CKMBINDEX, TROPONINI in the last 168 hours. BNP (last 3 results) No results for input(s): PROBNP in the last 8760 hours. HbA1C: No results for input(s): HGBA1C in the last 72 hours. CBG: No results for input(s): GLUCAP in the last 168 hours. Lipid Profile: No results for input(s): CHOL, HDL, LDLCALC, TRIG, CHOLHDL, LDLDIRECT in the last 72 hours. Thyroid Function Tests: Recent Labs    09/15/21 1209  TSH 1.778  FREET4 0.88   Anemia Panel: No results for input(s): VITAMINB12, FOLATE, FERRITIN, TIBC, IRON,  RETICCTPCT in the last 72 hours. Urine analysis:    Component Value Date/Time   COLORURINE YELLOW (A) 09/15/2021 1209   APPEARANCEUR CLOUDY (A) 09/15/2021 1209   APPEARANCEUR Turbid 06/08/2014 0928   LABSPEC 1.014 09/15/2021 1209   LABSPEC 1.018 06/08/2014 0928   PHURINE 5.0 09/15/2021 1209   GLUCOSEU NEGATIVE 09/15/2021 1209   GLUCOSEU Negative 06/08/2014 0928   HGBUR SMALL (A) 09/15/2021 1209   BILIRUBINUR NEGATIVE 09/15/2021 1209   BILIRUBINUR Negative 06/08/2014 0928   KETONESUR NEGATIVE 09/15/2021 1209   PROTEINUR NEGATIVE 09/15/2021 1209   NITRITE POSITIVE (A) 09/15/2021 1209   LEUKOCYTESUR MODERATE (A) 09/15/2021 1209   LEUKOCYTESUR Negative 06/08/2014 0928   Sepsis Labs: @LABRCNTIP (procalcitonin:4,lacticidven:4) ) Recent Results (from the past 240 hour(s))  SARS CORONAVIRUS 2 (TAT 6-24 HRS) Nasopharyngeal Nasopharyngeal Swab     Status: None   Collection Time: 09/13/21  5:56 PM   Specimen: Nasopharyngeal Swab  Result Value Ref Range Status   SARS Coronavirus 2 NEGATIVE NEGATIVE Final    Comment: (NOTE) SARS-CoV-2 target nucleic acids are NOT DETECTED.  The SARS-CoV-2 RNA is generally detectable in upper and lower respiratory specimens during the acute phase of infection. Negative results do not preclude SARS-CoV-2 infection, do not rule out co-infections with other pathogens, and should not be used as the sole basis for treatment or other patient management decisions. Negative results must be combined with clinical observations, patient history, and epidemiological information. The expected result is Negative.  Fact Sheet for Patients: 09/15/21  Fact Sheet for Healthcare Providers: HairSlick.no  This test is not yet approved or cleared by the quierodirigir.com FDA and  has been authorized for detection and/or diagnosis of SARS-CoV-2 by FDA under an Emergency Use Authorization (EUA). This EUA will  remain  in effect (meaning this test can be used) for the duration of the COVID-19 declaration under Se ction 564(b)(1) of the Act, 21 U.S.C. section 360bbb-3(b)(1), unless the authorization is terminated or revoked sooner.  Performed at [facility], [address], [place], [place]   Resp Panel by RT-PCR (Flu A&B, Covid) Nasopharyngeal Swab     Status: None   Collection Time: 09/15/21  3:33 PM   Specimen: Nasopharyngeal  Swab; Nasopharyngeal(NP) swabs in vial transport medium  Result Value Ref Range Status   SARS Coronavirus 2 by RT PCR NEGATIVE NEGATIVE Final    Comment: (NOTE) SARS-CoV-2 target nucleic acids are NOT DETECTED.  The SARS-CoV-2 RNA is generally detectable in upper respiratory specimens during the acute phase of infection. The lowest concentration of SARS-CoV-2 viral copies this assay can detect is 138 copies/mL. A negative result does not preclude SARS-Cov-2 infection and should not be used as the sole basis for treatment or other patient management decisions. A negative result may occur with  improper specimen collection/handling, submission of specimen other than nasopharyngeal swab, presence of viral mutation(s) within the areas targeted by this assay, and inadequate number of viral copies(<138 copies/mL). A negative result must be combined with clinical observations, patient history, and epidemiological information. The expected result is Negative.  Fact Sheet for Patients:  BloggerCourse.com  Fact Sheet for Healthcare Providers:  SeriousBroker.it  This test is no t yet approved or cleared by the [place] FDA and  has been authorized for detection and/or diagnosis of SARS-CoV-2 by FDA under an Emergency Use Authorization (EUA). This EUA will remain  in effect (meaning this test can be used) for the duration of the COVID-19 declaration under Section 564(b)(1) of the Act, 21 U.S.C.section  360bbb-3(b)(1), unless the authorization is terminated  or revoked sooner.       Influenza A by PCR NEGATIVE NEGATIVE Final   Influenza B by PCR NEGATIVE NEGATIVE Final    Comment: (NOTE) The Xpert Xpress SARS-CoV-2/FLU/RSV plus assay is intended as an aid in the diagnosis of influenza from Nasopharyngeal swab specimens and should not be used as a sole basis for treatment. Nasal washings and aspirates are unacceptable for Xpert Xpress SARS-CoV-2/FLU/RSV testing.  Fact Sheet for Patients: BloggerCourse.com  Fact Sheet for Healthcare Providers: SeriousBroker.it  This test is not yet approved or cleared by the [place] FDA and has been authorized for detection and/or diagnosis of SARS-CoV-2 by FDA under an Emergency Use Authorization (EUA). This EUA will remain in effect (meaning this test can be used) for the duration of the COVID-19 declaration under Section 564(b)(1) of the Act, 21 U.S.C. section 360bbb-3(b)(1), unless the authorization is terminated or revoked.  Performed at [facility], [address] [place], [place] [address]      Radiological Exams on Admission: DG Chest 2 View  Result Date: 09/13/2021 CLINICAL DATA:  Cough and congestion. Shortness of breath. Symptoms for 4 days. EXAM: CHEST - 2 VIEW COMPARISON:  None. FINDINGS: The cardiomediastinal contours are normal. Mild bronchial thickening. Pulmonary vasculature is normal. No consolidation, pleural effusion, or pneumothorax. No acute osseous abnormalities are seen. IMPRESSION: Mild bronchial thickening which can be seen with bronchitis or asthma. No pneumonia. Electronically Signed   By: Narda Rutherford M.D.   On: 09/13/2021 19:02   CT ABDOMEN PELVIS W CONTRAST  Result Date: 09/15/2021 CLINICAL DATA:  Abdominal distension EXAM: CT ABDOMEN AND PELVIS WITH CONTRAST TECHNIQUE: Multidetector CT imaging of the abdomen and pelvis was performed using the  standard protocol following bolus administration of intravenous contrast. CONTRAST:  17mL OMNIPAQUE IOHEXOL 350 MG/ML SOLN COMPARISON:  CT June 22, 2009. FINDINGS: Lower chest: Mild linear atelectasis in both lung bases. Otherwise, clear visualized lung bases. Hepatobiliary: No focal liver abnormality is seen. Status post cholecystectomy. No biliary dilatation. Mild focal fat along the falciform ligament. Pancreas: Unremarkable. No pancreatic ductal dilatation or surrounding inflammatory changes. Spleen: Normal in size without focal abnormality. Adrenals/Urinary Tract:  Adrenal glands are unremarkable. Kidneys are normal, without renal calculi, focal lesion, or hydronephrosis. Bladder is unremarkable. Stomach/Bowel: Stomach is within normal limits. Prior appendectomy. No evidence of bowel wall thickening, distention, or inflammatory changes. Vascular/Lymphatic: No significant vascular findings are present. No enlarged abdominal or pelvic lymph nodes. Reproductive: Probable uterine fibroid. Otherwise, uterus and bilateral adnexa are unremarkable. Other: Small umbilical hernia.  No abdominopelvic ascites. Musculoskeletal: No fracture is seen. IMPRESSION: 1. No acute abnormality or findings to explain the patient's reported abdominal distension. 2. Prior cholecystectomy and appendectomy. Electronically Signed   By: Feliberto Harts M.D.   On: 09/15/2021 14:32     EKG:  Not done in ED, will get one.   Assessment/Plan Principal Problem:   Epigastric abdominal pain Active Problems:   HEADACHE, TENSION   Nausea vomiting and diarrhea   GERD (gastroesophageal reflux disease)   Depression with anxiety   Hypokalemia   Gastritis without bleeding   Epigastric abdominal pain due to gastritis without bleeding: Dr. Servando Snare of GI is consulted. EDG is performed which showed mild gastritis.  -Placed on MedSurg bed for position -Supportive care -IV fluid: Patient received 1 L normal saline -Protonix to 40 mg twice  daily -Continue home Carafate -As needed Zofran for nausea and Dilaudid for pain  HEADACHE, TENSION -As needed Tylenol  Nausea vomiting and diarrhea -Follow-up C. difficile and GI pathogen panel  GERD (gastroesophageal reflux disease) -On Protonix  Depression with anxiety -Continue home medications  Hypokalemia: Potassium 3.2, magnesium 1.8 -Repleted potassium     DVT ppx: SQ Lovenox Code Status: Full code Family Communication: not done, no family member is at bed side.      Disposition Plan:  Anticipate discharge back to previous environment Consults called:  Dr. Servando Snare of GI Admission status and Level of care: Med-Surg:    for obs    Status is: Observation  The patient remains OBS appropriate and will d/c before 2 midnights.  Dispo: The patient is from: Home              Anticipated d/c is to: Home              Patient currently is not medically stable to d/c.   Difficult to place patient No         Date of Service 09/15/2021    Lorretta Harp [facility]   If 7PM-7AM, please contact night-coverage [email or web address] 09/15/2021, 6:15 PM

## 2021-09-16 ENCOUNTER — Observation Stay: Payer: Medicaid Other

## 2021-09-16 DIAGNOSIS — E876 Hypokalemia: Secondary | ICD-10-CM | POA: Diagnosis not present

## 2021-09-16 DIAGNOSIS — K2970 Gastritis, unspecified, without bleeding: Secondary | ICD-10-CM | POA: Diagnosis not present

## 2021-09-16 DIAGNOSIS — R1013 Epigastric pain: Secondary | ICD-10-CM | POA: Diagnosis not present

## 2021-09-16 DIAGNOSIS — F418 Other specified anxiety disorders: Secondary | ICD-10-CM | POA: Diagnosis not present

## 2021-09-16 LAB — BASIC METABOLIC PANEL
Anion gap: 6 (ref 5–15)
BUN: 10 mg/dL (ref 6–20)
CO2: 26 mmol/L (ref 22–32)
Calcium: 7.9 mg/dL — ABNORMAL LOW (ref 8.9–10.3)
Chloride: 105 mmol/L (ref 98–111)
Creatinine, Ser: 0.9 mg/dL (ref 0.44–1.00)
GFR, Estimated: >60 mL/min (ref >60)
Glucose, Bld: 84 mg/dL (ref 70–99)
Potassium: 4 mmol/L (ref 3.5–5.1)
Sodium: 137 mmol/L (ref 135–145)

## 2021-09-16 LAB — GLUCOSE, CAPILLARY
Glucose-Capillary: 81 mg/dL (ref 70–99)
Glucose-Capillary: 91 mg/dL (ref 70–99)

## 2021-09-16 MED ORDER — ADULT MULTIVITAMIN W/MINERALS CH: 1.0000 | ORAL_TABLET | Freq: Every day | ORAL | 0 refills | Status: AC

## 2021-09-16 MED ORDER — DESIPRAMINE HCL 25 MG PO TABS: 25.0000 mg | ORAL_TABLET | Freq: Every day | ORAL | 2 refills | Status: DC

## 2021-09-16 MED ORDER — IOHEXOL 350 MG/ML SOLN
75.0000 mL | Freq: Once | INTRAVENOUS | Status: AC | PRN
  Administered 2021-09-16: 75 mL via INTRAVENOUS

## 2021-09-16 MED ORDER — PANTOPRAZOLE SODIUM 40 MG PO TBEC: 40.0000 mg | DELAYED_RELEASE_TABLET | Freq: Two times a day (BID) | ORAL | 2 refills | Status: DC

## 2021-09-16 MED ORDER — ENSURE ENLIVE PO LIQD: 237.0000 mL | Freq: Three times a day (TID) | ORAL | 0 refills | Status: AC

## 2021-09-16 MED ORDER — ENSURE ENLIVE PO LIQD: 237.0000 mL | Freq: Three times a day (TID) | ORAL | Status: DC

## 2021-09-16 MED ORDER — ADULT MULTIVITAMIN W/MINERALS CH: 1.0000 | ORAL_TABLET | Freq: Every day | ORAL | Status: DC

## 2021-09-16 MED ORDER — SODIUM CHLORIDE 0.9 % IV SOLN: INTRAVENOUS | Status: DC

## 2021-09-16 NOTE — Progress Notes (Deleted)
PROGRESS NOTE  [name] OFB:[id number] DOB: 1976-08-12 DOA: 09/15/2021 PCP: Dortha Kern, MD   LOS: 0 days   Brief narrative:  Kim Mayo is a 45 y.o. female with medical history significant of GERD, depression, anxiety, s/p for cholecystectomy, tension headache, who presents with nausea, vomiting, diarrhea and epigastric abdominal pain.   Patient states that ever since she had COVID-19 infection in January, she has been having poor appetite and decreased oral intake.  Recently patient has intermittent nausea, vomiting and diarrhea.  Patient has nonbilious nonbloody vomiting several times each day.  Patient also has watery diarrhea, at least 4 times since yesterday.  She has lost 26 pounds recently.  She complains of epigastric abdominal pain, which is constant, sharp, severe, nonradiating.  Patient states that because of headache, she has been taking a lot of ibuprofen recently.  She states that she had bronchitis recently, which has improved. She is taking prednisone currently.  Still has mild dry cough, no shortness breath, chest pain fever or chills.  Patient denies symptoms of UTI.  No dark stool or rectal bleeding. She is supposed to have a endoscopy done on 10/3   ED Course: pt was found to have WBC 4.1, hemoglobin 15.5, negative pregnancy test, urinalysis is positive but with squamous cell contamination (patient does not have symptoms of UTI), negative COVID PCR, GFR >60, potassium 3.2, magnesium 1.8, temperature normal, blood pressure 125/105, heart rate 87, RR 16, oxygen saturation 97% on room air.  CT abdomen/pelvis is negative for acute intra-abdominal issues.  Patient is placed on MedSurg bed for observation, Dr. Servando Snare of GI is consulted for EGD.  Assessment/Plan:  Principal Problem:   Epigastric abdominal pain Active Problems:   HEADACHE, TENSION   Nausea vomiting and diarrhea   GERD (gastroesophageal reflux disease)   Depression with anxiety   Hypokalemia    Gastritis without bleeding   Epigastric abdominal pain postprandial pain. EGD showed gastritis but no obvious ulceration.  Status postcholecystectomy.  Patient complains of severe postprandial pain and has lost significant weight as per her  Spoke with GI.  We will get CTA of the abdomen to rule out mesenteric stenosis.  Continue Protonix twice daily Carafate Zofran.  Still on Dilaudid for pain.  Possibility of functional abdominal pain.  Tension headache As needed Tylenol   Nausea, vomiting and diarrhea Able to tolerate liquids.  No vomiting.  Has not had a bowel movement or urination since admission.  We will start the patient on IV fluids.  Will encourage oral intake.   GERD (gastroesophageal reflux disease) Continue Protonix.   Depression with anxiety On Paxil and Prozac, and Xanax as outpatient.   Hypokalemia: Improved potassium of 4.0.  DVT prophylaxis: enoxaparin (LOVENOX) injection 40 mg Start: 09/16/21 1000   Code Status: Full code  Family Communication: None  Status is: Observation  The patient will require care spanning > 2 midnights and should be moved to inpatient because: Ongoing diagnostic testing needed not appropriate for outpatient work up, IV treatments appropriate due to intensity of illness or inability to take PO, and Inpatient level of care appropriate due to severity of illness  Dispo: The patient is from: Home              Anticipated d/c is to: Home              Patient currently is not medically stable to d/c.   Difficult to place patient No  Consultants: GI  Procedures: None  Anti-infectives:  None  Anti-infectives (From admission, onward)    None      Subjective: Today, patient was seen and examined at bedside.  Patient complains of severe abdominal pain especially after eating.  Able to tolerate liquid but not solid food.  States that she has not had bowel movement urination since yesterday.  Objective: Vitals:   09/16/21 0423  09/16/21 0815  BP: 104/75 131/75  Pulse: (!) 59 (!) 55  Resp: 16 16  Temp: 97.7 F (36.5 C) 97.6 F (36.4 C)  SpO2: 97% 96%    Intake/Output Summary (Last 24 hours) at 09/16/2021 1040 Last data filed at 09/15/2021 1930 Gross per 24 hour  Intake 531.33 ml  Output --  Net 531.33 ml   Filed Weights   09/15/21 1205 09/15/21 1618  Weight: 74.4 kg 74.4 kg   Body mass index is 27.29 kg/m.   Physical Exam: GENERAL: Patient is alert awake and oriented. Not in obvious distress.  Mildly anxious HENT: No scleral pallor or icterus. Pupils equally reactive to light. Oral mucosa is moist NECK: is supple, no gross swelling noted. CHEST: Clear to auscultation. No crackles or wheezes.  Diminished breath sounds bilaterally. CVS: S1 and S2 heard, no murmur. Regular rate and rhythm.  ABDOMEN: Soft, non-tender, bowel sounds are present. EXTREMITIES: No edema. CNS: Cranial nerves are intact. No focal motor deficits. SKIN: warm and dry without rashes.  Data Review: I have personally reviewed the following laboratory data and studies,  CBC: Recent Labs  Lab 09/15/21 1208  WBC 4.1  HGB 15.5*  HCT 42.5  MCV 89.9  PLT 211   Basic Metabolic Panel: Recent Labs  Lab 09/15/21 1208 09/15/21 1209 09/16/21 0454  NA 136  --  137  K 3.2*  --  4.0  CL 102  --  105  CO2 26  --  26  GLUCOSE 77  --  84  BUN 10  --  10  CREATININE 1.05*  --  0.90  CALCIUM 8.9  --  7.9*  MG  --  1.8  --    Liver Function Tests: Recent Labs  Lab 09/15/21 1208  AST 20  ALT 14  ALKPHOS 68  BILITOT 0.8  PROT 8.0  ALBUMIN 4.3   Recent Labs  Lab 09/15/21 1208  LIPASE 30   No results for input(s): AMMONIA in the last 168 hours. Cardiac Enzymes: No results for input(s): CKTOTAL, CKMB, CKMBINDEX, TROPONINI in the last 168 hours. BNP (last 3 results) No results for input(s): BNP in the last 8760 hours.  ProBNP (last 3 results) No results for input(s): PROBNP in the last 8760 hours.  CBG: Recent Labs   Lab 09/16/21 0817  GLUCAP 81   Recent Results (from the past 240 hour(s))  SARS CORONAVIRUS 2 (TAT 6-24 HRS) Nasopharyngeal Nasopharyngeal Swab     Status: None   Collection Time: 09/13/21  5:56 PM   Specimen: Nasopharyngeal Swab  Result Value Ref Range Status   SARS Coronavirus 2 NEGATIVE NEGATIVE Final    Comment: (NOTE) SARS-CoV-2 target nucleic acids are NOT DETECTED.  The SARS-CoV-2 RNA is generally detectable in upper and lower respiratory specimens during the acute phase of infection. Negative results do not preclude SARS-CoV-2 infection, do not rule out co-infections with other pathogens, and should not be used as the sole basis for treatment or other patient management decisions. Negative results must be combined with clinical observations, patient history, and epidemiological information. The expected result is Negative.  Fact Sheet  for Patients: HairSlick.no  Fact Sheet for Healthcare Providers: quierodirigir.com  This test is not yet approved or cleared by the [place] FDA and  has been authorized for detection and/or diagnosis of SARS-CoV-2 by FDA under an Emergency Use Authorization (EUA). This EUA will remain  in effect (meaning this test can be used) for the duration of the COVID-19 declaration under Se ction 564(b)(1) of the Act, 21 U.S.C. section 360bbb-3(b)(1), unless the authorization is terminated or revoked sooner.  Performed at [facility], [address] [place], [place] [address]   Resp Panel by RT-PCR (Flu A&B, Covid) Nasopharyngeal Swab     Status: None   Collection Time: 09/15/21  3:33 PM   Specimen: Nasopharyngeal Swab; Nasopharyngeal(NP) swabs in vial transport medium  Result Value Ref Range Status   SARS Coronavirus 2 by RT PCR NEGATIVE NEGATIVE Final    Comment: (NOTE) SARS-CoV-2 target nucleic acids are NOT DETECTED.  The SARS-CoV-2 RNA is generally detectable in upper  respiratory specimens during the acute phase of infection. The lowest concentration of SARS-CoV-2 viral copies this assay can detect is 138 copies/mL. A negative result does not preclude SARS-Cov-2 infection and should not be used as the sole basis for treatment or other patient management decisions. A negative result may occur with  improper specimen collection/handling, submission of specimen other than nasopharyngeal swab, presence of viral mutation(s) within the areas targeted by this assay, and inadequate number of viral copies(<138 copies/mL). A negative result must be combined with clinical observations, patient history, and epidemiological information. The expected result is Negative.  Fact Sheet for Patients:  BloggerCourse.com  Fact Sheet for Healthcare Providers:  SeriousBroker.it  This test is no t yet approved or cleared by the [place] FDA and  has been authorized for detection and/or diagnosis of SARS-CoV-2 by FDA under an Emergency Use Authorization (EUA). This EUA will remain  in effect (meaning this test can be used) for the duration of the COVID-19 declaration under Section 564(b)(1) of the Act, 21 U.S.C.section 360bbb-3(b)(1), unless the authorization is terminated  or revoked sooner.       Influenza A by PCR NEGATIVE NEGATIVE Final   Influenza B by PCR NEGATIVE NEGATIVE Final    Comment: (NOTE) The Xpert Xpress SARS-CoV-2/FLU/RSV plus assay is intended as an aid in the diagnosis of influenza from Nasopharyngeal swab specimens and should not be used as a sole basis for treatment. Nasal washings and aspirates are unacceptable for Xpert Xpress SARS-CoV-2/FLU/RSV testing.  Fact Sheet for Patients: BloggerCourse.com  Fact Sheet for Healthcare Providers: SeriousBroker.it  This test is not yet approved or cleared by the [place] FDA and has been  authorized for detection and/or diagnosis of SARS-CoV-2 by FDA under an Emergency Use Authorization (EUA). This EUA will remain in effect (meaning this test can be used) for the duration of the COVID-19 declaration under Section 564(b)(1) of the Act, 21 U.S.C. section 360bbb-3(b)(1), unless the authorization is terminated or revoked.  Performed at [facility], [address] [place], [place] [address]      Studies: CT ABDOMEN PELVIS W CONTRAST  Result Date: 09/15/2021 CLINICAL DATA:  Abdominal distension EXAM: CT ABDOMEN AND PELVIS WITH CONTRAST TECHNIQUE: Multidetector CT imaging of the abdomen and pelvis was performed using the standard protocol following bolus administration of intravenous contrast. CONTRAST:  49mL OMNIPAQUE IOHEXOL 350 MG/ML SOLN COMPARISON:  CT June 22, 2009. FINDINGS: Lower chest: Mild linear atelectasis in both lung bases. Otherwise, clear visualized lung bases. Hepatobiliary:  No focal liver abnormality is seen. Status post cholecystectomy. No biliary dilatation. Mild focal fat along the falciform ligament. Pancreas: Unremarkable. No pancreatic ductal dilatation or surrounding inflammatory changes. Spleen: Normal in size without focal abnormality. Adrenals/Urinary Tract: Adrenal glands are unremarkable. Kidneys are normal, without renal calculi, focal lesion, or hydronephrosis. Bladder is unremarkable. Stomach/Bowel: Stomach is within normal limits. Prior appendectomy. No evidence of bowel wall thickening, distention, or inflammatory changes. Vascular/Lymphatic: No significant vascular findings are present. No enlarged abdominal or pelvic lymph nodes. Reproductive: Probable uterine fibroid. Otherwise, uterus and bilateral adnexa are unremarkable. Other: Small umbilical hernia.  No abdominopelvic ascites. Musculoskeletal: No fracture is seen. IMPRESSION: 1. No acute abnormality or findings to explain the patient's reported abdominal distension. 2. Prior  cholecystectomy and appendectomy. Electronically Signed   By: Feliberto Harts M.D.   On: 09/15/2021 14:32      Joycelyn Das, MD  [facility] 09/16/2021  If 7PM-7AM, please contact night-coverage

## 2021-09-16 NOTE — Progress Notes (Signed)
Kim Minium, MD [facility]   [address], [address] [place], [place] [address] Phone: [phone] Fax : [phone]   Subjective: Patient was admitted with abdominal pain and a self-report of significant weight loss.  The patient states she was up near 190 pounds prior to coming in at 164 pounds yesterday.  When reviewing the charts the patient was 165 approximately 10 years ago but I have no other weights on her since then.  She continues to report that she has abdominal pain after eating.  She had an upper endoscopy with minimal gastritis that was biopsied.  The patient had a CT angiogram which did not show any sign of vascular compromise to explain the postprandial discomfort.   Objective: Vital signs in last 24 hours: Vitals:   09/15/21 2122 09/15/21 2300 09/16/21 0423 09/16/21 0815  BP: (!) 97/58 101/63 104/75 131/75  Pulse: (!) 54 (!) 55 (!) 59 (!) 55  Resp: 16 15 16 16   Temp: 98.2 F (36.8 C) 98 F (36.7 C) 97.7 F (36.5 C) 97.6 F (36.4 C)  TempSrc: Oral Oral Oral   SpO2: 96% 97% 97% 96%  Weight:      Height:       Weight change:   Intake/Output Summary (Last 24 hours) at 09/16/2021 1430 Last data filed at 09/15/2021 1930 Gross per 24 hour  Intake 531.33 ml  Output --  Net 531.33 ml     Exam: General: patient sitting in bed in no apparent distress alert and oriented 3   Lab Results: @LABTEST2 @ Micro Results: Recent Results (from the past 240 hour(s))  SARS CORONAVIRUS 2 (TAT 6-24 HRS) Nasopharyngeal Nasopharyngeal Swab     Status: None   Collection Time: 09/13/21  5:56 PM   Specimen: Nasopharyngeal Swab  Result Value Ref Range Status   SARS Coronavirus 2 NEGATIVE NEGATIVE Final    Comment: (NOTE) SARS-CoV-2 target nucleic acids are NOT DETECTED.  The SARS-CoV-2 RNA is generally detectable in upper and lower respiratory specimens during the acute phase of infection. Negative results do not preclude SARS-CoV-2 infection, do not rule out co-infections with  other pathogens, and should not be used as the sole basis for treatment or other patient management decisions. Negative results must be combined with clinical observations, patient history, and epidemiological information. The expected result is Negative.  Fact Sheet for Patients:  Fact Sheet for Healthcare Providers: 09/15/21  This test is not yet approved or cleared by the HairSlick.no FDA and  has been authorized for detection and/or diagnosis of SARS-CoV-2 by FDA under an Emergency Use Authorization (EUA). This EUA will remain  in effect (meaning this test can be used) for the duration of the COVID-19 declaration under Se ction 564(b)(1) of the Act, 21 U.S.C. section 360bbb-3(b)(1), unless the authorization is terminated or revoked sooner.  Performed at [facility], [address], [place], [address] [place]   Resp Panel by RT-PCR (Flu A&B, Covid) Nasopharyngeal Swab     Status: None   Collection Time: 09/15/21  3:33 PM   Specimen: Nasopharyngeal Swab; Nasopharyngeal(NP) swabs in vial transport medium  Result Value Ref Range Status   SARS Coronavirus 2 by RT PCR NEGATIVE NEGATIVE Final    Comment: (NOTE) SARS-CoV-2 target nucleic acids are NOT DETECTED.  The SARS-CoV-2 RNA is generally detectable in upper respiratory specimens during the acute phase of infection. The lowest concentration of SARS-CoV-2 viral copies this assay can detect is 138 copies/mL. A negative result does not preclude SARS-Cov-2 infection  and should not be used as the sole basis for treatment or other patient management decisions. A negative result may occur with  improper specimen collection/handling, submission of specimen other than nasopharyngeal swab, presence of viral mutation(s) within the areas targeted by this assay, and inadequate number of viral copies(<138 copies/mL). A negative result must be combined  with clinical observations, patient history, and epidemiological information. The expected result is Negative.  Fact Sheet for Patients:  BloggerCourse.com  Fact Sheet for Healthcare Providers:  SeriousBroker.it  This test is no t yet approved or cleared by the [place] FDA and  has been authorized for detection and/or diagnosis of SARS-CoV-2 by FDA under an Emergency Use Authorization (EUA). This EUA will remain  in effect (meaning this test can be used) for the duration of the COVID-19 declaration under Section 564(b)(1) of the Act, 21 U.S.C.section 360bbb-3(b)(1), unless the authorization is terminated  or revoked sooner.       Influenza A by PCR NEGATIVE NEGATIVE Final   Influenza B by PCR NEGATIVE NEGATIVE Final    Comment: (NOTE) The Xpert Xpress SARS-CoV-2/FLU/RSV plus assay is intended as an aid in the diagnosis of influenza from Nasopharyngeal swab specimens and should not be used as a sole basis for treatment. Nasal washings and aspirates are unacceptable for Xpert Xpress SARS-CoV-2/FLU/RSV testing.  Fact Sheet for Patients: BloggerCourse.com  Fact Sheet for Healthcare Providers: SeriousBroker.it  This test is not yet approved or cleared by the [place] FDA and has been authorized for detection and/or diagnosis of SARS-CoV-2 by FDA under an Emergency Use Authorization (EUA). This EUA will remain in effect (meaning this test can be used) for the duration of the COVID-19 declaration under Section 564(b)(1) of the Act, 21 U.S.C. section 360bbb-3(b)(1), unless the authorization is terminated or revoked.  Performed at [facility], [address] [place], [place] [address]    Studies/Results: CT ABDOMEN PELVIS W CONTRAST  Result Date: 09/15/2021 CLINICAL DATA:  Abdominal distension EXAM: CT ABDOMEN AND PELVIS WITH CONTRAST TECHNIQUE:  Multidetector CT imaging of the abdomen and pelvis was performed using the standard protocol following bolus administration of intravenous contrast. CONTRAST:  51mL OMNIPAQUE IOHEXOL 350 MG/ML SOLN COMPARISON:  CT June 22, 2009. FINDINGS: Lower chest: Mild linear atelectasis in both lung bases. Otherwise, clear visualized lung bases. Hepatobiliary: No focal liver abnormality is seen. Status post cholecystectomy. No biliary dilatation. Mild focal fat along the falciform ligament. Pancreas: Unremarkable. No pancreatic ductal dilatation or surrounding inflammatory changes. Spleen: Normal in size without focal abnormality. Adrenals/Urinary Tract: Adrenal glands are unremarkable. Kidneys are normal, without renal calculi, focal lesion, or hydronephrosis. Bladder is unremarkable. Stomach/Bowel: Stomach is within normal limits. Prior appendectomy. No evidence of bowel wall thickening, distention, or inflammatory changes. Vascular/Lymphatic: No significant vascular findings are present. No enlarged abdominal or pelvic lymph nodes. Reproductive: Probable uterine fibroid. Otherwise, uterus and bilateral adnexa are unremarkable. Other: Small umbilical hernia.  No abdominopelvic ascites. Musculoskeletal: No fracture is seen. IMPRESSION: 1. No acute abnormality or findings to explain the patient's reported abdominal distension. 2. Prior cholecystectomy and appendectomy. Electronically Signed   By: Feliberto Harts M.D.   On: 09/15/2021 14:32   CT Angio Abd/Pel w/ and/or w/o  Result Date: 09/16/2021 CLINICAL DATA:  45 year old female with postprandial epigastric pain. Evaluate for mesenteric ischemia. EXAM: CT ANGIOGRAPHY ABDOMEN AND PELVIS WITH CONTRAST AND WITHOUT CONTRAST TECHNIQUE: Multidetector CT imaging of the abdomen and pelvis was performed using the standard protocol during bolus administration of intravenous contrast. Multiplanar  reconstructed images and MIPs were obtained and reviewed to evaluate the vascular  anatomy. CONTRAST:  63mL OMNIPAQUE IOHEXOL 350 MG/ML SOLN COMPARISON:  09/15/2021 FINDINGS: VASCULAR Aorta: Normal caliber aorta without aneurysm, dissection, vasculitis or significant stenosis. Celiac: Patent without evidence of aneurysm, dissection, vasculitis or significant stenosis. Replaced left hepatic artery arising from the left gastric artery. SMA: Patent without evidence of aneurysm, dissection, vasculitis or significant stenosis. Renals: Single right and dual left renal arteries are patent without evidence of aneurysm, dissection, vasculitis, fibromuscular dysplasia or significant stenosis. IMA: Patent without evidence of aneurysm, dissection, vasculitis or significant stenosis. Inflow: Patent without evidence of aneurysm, dissection, vasculitis or significant stenosis. Proximal Outflow: Bilateral common femoral and visualized portions of the superficial and profunda femoral arteries are patent without evidence of aneurysm, dissection, vasculitis or significant stenosis. Veins: The hepatic veins are widely patent. The portal system is widely patent and normal in caliber. The renal veins are patent bilaterally in standard anatomic configuration. No evidence of iliocaval thrombosis or anomaly. Review of the MIP images confirms the above findings. NON-VASCULAR Lower chest: No acute abnormality. Hepatobiliary: No focal liver abnormality is seen. Status post cholecystectomy. No biliary dilatation. Pancreas: Unremarkable. No pancreatic ductal dilatation or surrounding inflammatory changes. Spleen: Normal in size without focal abnormality. Adrenals/Urinary Tract: Adrenal glands are unremarkable. Kidneys are normal, without renal calculi, focal lesion, or hydronephrosis. Bladder is unremarkable. Stomach/Bowel: Stomach is within normal limits. Appendix is surgically absent. Few sigmoid diverticula without surrounding inflammatory changes. No evidence of bowel wall thickening, distention, or inflammatory changes.  Lymphatic: No abdominopelvic lymphadenopathy. Reproductive: Uterus and bilateral adnexa are unremarkable. Other: No abdominal wall hernia or abnormality. No abdominopelvic ascites. Musculoskeletal: No acute or significant osseous findings. IMPRESSION: VASCULAR Widely patent visceral arteries, no evidence of mesenteric ischemia. NON-VASCULAR No acute abdominopelvic abnormality to explain postprandial epigastric abdominal pain. Marliss Coots, MD [facility] Electronically Signed   By: Marliss Coots M.D.   On: 09/16/2021 12:01   Medications: I have reviewed the patient's current medications. Scheduled Meds:  enoxaparin (LOVENOX) injection  40 mg Subcutaneous Q24H   feeding supplement  237 mL Oral TID BM   FLUoxetine  10 mg Oral Daily   influenza vac split quadrivalent PF  0.5 mL Intramuscular Tomorrow-1000   multivitamin with minerals  1 tablet Oral Daily   pantoprazole  40 mg Oral BID   PARoxetine  20 mg Oral Daily   predniSONE  40 mg Oral Q breakfast   sucralfate  1 g Oral Q6H   Continuous Infusions:  sodium chloride 125 mL/hr at 09/16/21 1056   PRN Meds:.acetaminophen, albuterol, ALPRAZolam, dextromethorphan-guaiFENesin, HYDROmorphone (DILAUDID) injection, ondansetron (ZOFRAN) IV   Assessment: Principal Problem:   Epigastric abdominal pain Active Problems:   HEADACHE, TENSION   Nausea vomiting and diarrhea   GERD (gastroesophageal reflux disease)   Depression with anxiety   Hypokalemia   Gastritis without bleeding    Plan: This patient had a normal CTA and normal labs including a upper endoscopy yesterday that showed no findings to explain all of her symptoms.  The patient has been told that she likely has functional bowel disorder and was recommended to be treated as such.  The patient should be started on desipramine 25 mg daily at bedtime and follow up as an outpatient.  The patient has seen the [facility] in the past  but now reports to want to switch over to our practice.  Nothing further to add at this time.  She  is welcome to follow up as an outpatient.  I will sign off.  Please call if any further GI concerns or questions.  We would like to thank you for the opportunity to participate in the care of [place].    LOS: 0 days   Sherlyn Hay 09/16/2021, 2:30 PM Pager [phone] 7am-5pm  Check AMION for 5pm -7am coverage and on weekends

## 2021-09-16 NOTE — Discharge Summary (Signed)
Physician Discharge Summary  Kim Mayo AJO:[id number] DOB: 06-19-1976 DOA: 09/15/2021  PCP: Dortha Kern, MD  Admit date: 09/15/2021 Discharge date: 09/16/2021  Admitted From: Home  Discharge disposition: Home   Recommendations for Outpatient Follow-Up:   Follow up with your psychiatrist in 1 to 2 weeks.  Patient would benefit from outpatient counseling.  Possibility of functional abdominal pain.  Discharge Diagnosis:   Principal Problem:   Epigastric abdominal pain Active Problems:   HEADACHE, TENSION   Nausea vomiting and diarrhea   GERD (gastroesophageal reflux disease)   Depression with anxiety   Hypokalemia   Gastritis without bleeding    Discharge Condition: Improved.  Diet recommendation:  Regular.  Wound care: None.  Code status: Full.   History of Present Illness:   Kim Mayo is a 45 y.o. female with medical history significant of GERD, depression, anxiety, s/p for cholecystectomy, tension headache, enroute hospital with nausea vomiting diarrhea and abdominal pain.  She stated that ever since she had COVID had been having abdominal pain and lost weight.  In the ED vitals were stable including labs except for hypokalemia.  CT scan of the abdomen pelvis was negative for acute findings.  GI was consulted and she was admitted hospital for further evaluation and treatment.   Hospital Course:   Following conditions were addressed during hospitalization as listed below,  Epigastric abdominal pain, postprandial pain with history of weight loss as per the patient. EGD showed gastritis but no obvious ulceration.  Status postcholecystectomy.  CT scan of the abdomen pelvis negative for acute findings.  CTA of the abdomen did not show any stenosis.  Patient was advised to continue Protonix twice daily, Carafate, antiemetics on discharge.  Discouraged on narcotics.  There is a high possibility of functional abdominal pain.  GI has recommended desipramine 25  mg at bedtime on discharge.  Patient could follow-up with GI as outpatient.  Will likely benefit from psychiatry reevaluation/counseling  Tension headache As needed Tylenol   Nausea, vomiting and diarrhea Improved.  Able to tolerate liquids.  No vomiting.  Has not had a bowel movement since admission..  Encouraged oral intake.  GERD (gastroesophageal reflux disease) Continue Protonix twice daily..   Depression with anxiety On Paxil and Prozac, and Xanax as outpatient.  Will discontinue Paxil.  Continue Prozac.  Patient will be started on desipramine.   Hypokalemia: Improved after supplement, potassium of 4.0 at the time of discharge..  Disposition.  At this time, patient is stable for disposition with outpatient psychiatry, PCP and GI follow-up.  Spoke with the patient's daughter and family at bedside  Medical Consultants:   GI  Procedures:    EGD Subjective:   Today, patient was seen and examined at bedside.  Complains epigastric pain after eating.  Discharge Exam:   Vitals:   09/16/21 0423 09/16/21 0815  BP: 104/75 131/75  Pulse: (!) 59 (!) 55  Resp: 16 16  Temp: 97.7 F (36.5 C) 97.6 F (36.4 C)  SpO2: 97% 96%   Vitals:   09/15/21 2122 09/15/21 2300 09/16/21 0423 09/16/21 0815  BP: (!) 97/58 101/63 104/75 131/75  Pulse: (!) 54 (!) 55 (!) 59 (!) 55  Resp: 16 15 16 16   Temp: 98.2 F (36.8 C) 98 F (36.7 C) 97.7 F (36.5 C) 97.6 F (36.4 C)  TempSrc: Oral Oral Oral   SpO2: 96% 97% 97% 96%  Weight:      Height:        General: Alert awake,  not in obvious distress, mildly anxious HENT: pupils equally reacting to light,  No scleral pallor or icterus noted. Oral mucosa is moist.  Chest:  Clear breath sounds.  Diminished breath sounds bilaterally. No crackles or wheezes.  CVS: S1 &S2 heard. No murmur.  Regular rate and rhythm. Abdomen: Soft, nontender, nondistended.  Bowel sounds are heard.   Extremities: No cyanosis, clubbing or edema.  Peripheral pulses are  palpable. Psych: Alert, awake and oriented, normal mood CNS:  No cranial nerve deficits.  Power equal in all extremities.   Skin: Warm and dry.  No rashes noted.  The results of significant diagnostics from this hospitalization (including imaging, microbiology, ancillary and laboratory) are listed below for reference.     Diagnostic Studies:   CT ABDOMEN PELVIS W CONTRAST  Result Date: 09/15/2021 CLINICAL DATA:  Abdominal distension EXAM: CT ABDOMEN AND PELVIS WITH CONTRAST TECHNIQUE: Multidetector CT imaging of the abdomen and pelvis was performed using the standard protocol following bolus administration of intravenous contrast. CONTRAST:  19mL OMNIPAQUE IOHEXOL 350 MG/ML SOLN COMPARISON:  CT June 22, 2009. FINDINGS: Lower chest: Mild linear atelectasis in both lung bases. Otherwise, clear visualized lung bases. Hepatobiliary: No focal liver abnormality is seen. Status post cholecystectomy. No biliary dilatation. Mild focal fat along the falciform ligament. Pancreas: Unremarkable. No pancreatic ductal dilatation or surrounding inflammatory changes. Spleen: Normal in size without focal abnormality. Adrenals/Urinary Tract: Adrenal glands are unremarkable. Kidneys are normal, without renal calculi, focal lesion, or hydronephrosis. Bladder is unremarkable. Stomach/Bowel: Stomach is within normal limits. Prior appendectomy. No evidence of bowel wall thickening, distention, or inflammatory changes. Vascular/Lymphatic: No significant vascular findings are present. No enlarged abdominal or pelvic lymph nodes. Reproductive: Probable uterine fibroid. Otherwise, uterus and bilateral adnexa are unremarkable. Other: Small umbilical hernia.  No abdominopelvic ascites. Musculoskeletal: No fracture is seen. IMPRESSION: 1. No acute abnormality or findings to explain the patient's reported abdominal distension. 2. Prior cholecystectomy and appendectomy. Electronically Signed   By: Feliberto Harts M.D.   On: 09/15/2021  14:32   CT Angio Abd/Pel w/ and/or w/o  Result Date: 09/16/2021 CLINICAL DATA:  45 year old female with postprandial epigastric pain. Evaluate for mesenteric ischemia. EXAM: CT ANGIOGRAPHY ABDOMEN AND PELVIS WITH CONTRAST AND WITHOUT CONTRAST TECHNIQUE: Multidetector CT imaging of the abdomen and pelvis was performed using the standard protocol during bolus administration of intravenous contrast. Multiplanar reconstructed images and MIPs were obtained and reviewed to evaluate the vascular anatomy. CONTRAST:  70mL OMNIPAQUE IOHEXOL 350 MG/ML SOLN COMPARISON:  09/15/2021 FINDINGS: VASCULAR Aorta: Normal caliber aorta without aneurysm, dissection, vasculitis or significant stenosis. Celiac: Patent without evidence of aneurysm, dissection, vasculitis or significant stenosis. Replaced left hepatic artery arising from the left gastric artery. SMA: Patent without evidence of aneurysm, dissection, vasculitis or significant stenosis. Renals: Single right and dual left renal arteries are patent without evidence of aneurysm, dissection, vasculitis, fibromuscular dysplasia or significant stenosis. IMA: Patent without evidence of aneurysm, dissection, vasculitis or significant stenosis. Inflow: Patent without evidence of aneurysm, dissection, vasculitis or significant stenosis. Proximal Outflow: Bilateral common femoral and visualized portions of the superficial and profunda femoral arteries are patent without evidence of aneurysm, dissection, vasculitis or significant stenosis. Veins: The hepatic veins are widely patent. The portal system is widely patent and normal in caliber. The renal veins are patent bilaterally in standard anatomic configuration. No evidence of iliocaval thrombosis or anomaly. Review of the MIP images confirms the above findings. NON-VASCULAR Lower chest: No acute abnormality. Hepatobiliary: No focal liver abnormality is seen. Status  post cholecystectomy. No biliary dilatation. Pancreas: Unremarkable.  No pancreatic ductal dilatation or surrounding inflammatory changes. Spleen: Normal in size without focal abnormality. Adrenals/Urinary Tract: Adrenal glands are unremarkable. Kidneys are normal, without renal calculi, focal lesion, or hydronephrosis. Bladder is unremarkable. Stomach/Bowel: Stomach is within normal limits. Appendix is surgically absent. Few sigmoid diverticula without surrounding inflammatory changes. No evidence of bowel wall thickening, distention, or inflammatory changes. Lymphatic: No abdominopelvic lymphadenopathy. Reproductive: Uterus and bilateral adnexa are unremarkable. Other: No abdominal wall hernia or abnormality. No abdominopelvic ascites. Musculoskeletal: No acute or significant osseous findings. IMPRESSION: VASCULAR Widely patent visceral arteries, no evidence of mesenteric ischemia. NON-VASCULAR No acute abdominopelvic abnormality to explain postprandial epigastric abdominal pain. Marliss Coots, MD [facility] Electronically Signed   By: Marliss Coots M.D.   On: 09/16/2021 12:01     Labs:   Basic Metabolic Panel: Recent Labs  Lab 09/15/21 1208 09/15/21 1209 09/16/21 0454  NA 136  --  137  K 3.2*  --  4.0  CL 102  --  105  CO2 26  --  26  GLUCOSE 77  --  84  BUN 10  --  10  CREATININE 1.05*  --  0.90  CALCIUM 8.9  --  7.9*  MG  --  1.8  --    GFR Estimated Creatinine Clearance: 80.6 mL/min (by C-G formula based on SCr of 0.9 mg/dL). Liver Function Tests: Recent Labs  Lab 09/15/21 1208  AST 20  ALT 14  ALKPHOS 68  BILITOT 0.8  PROT 8.0  ALBUMIN 4.3   Recent Labs  Lab 09/15/21 1208  LIPASE 30   No results for input(s): AMMONIA in the last 168 hours. Coagulation profile Recent Labs  Lab 09/15/21 1921  INR 1.1    CBC: Recent Labs  Lab 09/15/21 1208  WBC 4.1  HGB 15.5*  HCT 42.5  MCV 89.9  PLT 211   Cardiac Enzymes: No results for input(s): CKTOTAL, CKMB, CKMBINDEX,  TROPONINI in the last 168 hours. BNP: Invalid input(s): POCBNP CBG: Recent Labs  Lab 09/16/21 0817 09/16/21 1231  GLUCAP 81 91   D-Dimer No results for input(s): DDIMER in the last 72 hours. Hgb A1c No results for input(s): HGBA1C in the last 72 hours. Lipid Profile No results for input(s): CHOL, HDL, LDLCALC, TRIG, CHOLHDL, LDLDIRECT in the last 72 hours. Thyroid function studies Recent Labs    09/15/21 1209  TSH 1.778   Anemia work up No results for input(s): VITAMINB12, FOLATE, FERRITIN, TIBC, IRON, RETICCTPCT in the last 72 hours. Microbiology Recent Results (from the past 240 hour(s))  SARS CORONAVIRUS 2 (TAT 6-24 HRS) Nasopharyngeal Nasopharyngeal Swab     Status: None   Collection Time: 09/13/21  5:56 PM   Specimen: Nasopharyngeal Swab  Result Value Ref Range Status   SARS Coronavirus 2 NEGATIVE NEGATIVE Final    Comment: (NOTE) SARS-CoV-2 target nucleic acids are NOT DETECTED.  The SARS-CoV-2 RNA is generally detectable in upper and lower respiratory specimens during the acute phase of infection. Negative results do not preclude SARS-CoV-2 infection, do not rule out co-infections with other pathogens, and should not be used as the sole basis for treatment or other patient management decisions. Negative results must be combined with clinical observations, patient history, and epidemiological information. The expected result is Negative.  Fact Sheet for Patients: HairSlick.no  Fact Sheet for Healthcare Providers: quierodirigir.com  This test is not yet approved or cleared by the [place] FDA and  has been authorized for detection and/or diagnosis of SARS-CoV-2 by FDA under an Emergency Use Authorization (EUA). This EUA will remain  in effect (meaning this test can be used) for the duration of the COVID-19 declaration under Se ction 564(b)(1) of the Act, 21 U.S.C. section 360bbb-3(b)(1), unless the  authorization is terminated or revoked sooner.  Performed at [facility], [address], [place], [place] [address]   Resp Panel by RT-PCR (Flu A&B, Covid) Nasopharyngeal Swab     Status: None   Collection Time: 09/15/21  3:33 PM   Specimen: Nasopharyngeal Swab; Nasopharyngeal(NP) swabs in vial transport medium  Result Value Ref Range Status   SARS Coronavirus 2 by RT PCR NEGATIVE NEGATIVE Final    Comment: (NOTE) SARS-CoV-2 target nucleic acids are NOT DETECTED.  The SARS-CoV-2 RNA is generally detectable in upper respiratory specimens during the acute phase of infection. The lowest concentration of SARS-CoV-2 viral copies this assay can detect is 138 copies/mL. A negative result does not preclude SARS-Cov-2 infection and should not be used as the sole basis for treatment or other patient management decisions. A negative result may occur with  improper specimen collection/handling, submission of specimen other than nasopharyngeal swab, presence of viral mutation(s) within the areas targeted by this assay, and inadequate number of viral copies(<138 copies/mL). A negative result must be combined with clinical observations, patient history, and epidemiological information. The expected result is Negative.  Fact Sheet for Patients:  BloggerCourse.com  Fact Sheet for Healthcare Providers:  SeriousBroker.it  This test is no t yet approved or cleared by the [place] FDA and  has been authorized for detection and/or diagnosis of SARS-CoV-2 by FDA under an Emergency Use Authorization (EUA). This EUA will remain  in effect (meaning this test can be used) for the duration of the COVID-19 declaration under Section 564(b)(1) of the Act, 21 U.S.C.section 360bbb-3(b)(1), unless the authorization is terminated  or revoked sooner.       Influenza A by PCR NEGATIVE NEGATIVE Final   Influenza B by PCR NEGATIVE NEGATIVE Final     Comment: (NOTE) The Xpert Xpress SARS-CoV-2/FLU/RSV plus assay is intended as an aid in the diagnosis of influenza from Nasopharyngeal swab specimens and should not be used as a sole basis for treatment. Nasal washings and aspirates are unacceptable for Xpert Xpress SARS-CoV-2/FLU/RSV testing.  Fact Sheet for Patients: BloggerCourse.com  Fact Sheet for Healthcare Providers: SeriousBroker.it  This test is not yet approved or cleared by the [place] FDA and has been authorized for detection and/or diagnosis of SARS-CoV-2 by FDA under an Emergency Use Authorization (EUA). This EUA will remain in effect (meaning this test can be used) for the duration of the COVID-19 declaration under Section 564(b)(1) of the Act, 21 U.S.C. section 360bbb-3(b)(1), unless the authorization is terminated or revoked.  Performed at [facility], [address] [place], [place] [address]      Discharge Instructions:   Discharge Instructions     Diet general   Complete by: As directed    Discharge instructions   Complete by: As directed    Follow-up with your psychiatrist in 1 to 2 weeks.  Discuss about counseling and abdominal pain issues.  Follow-up with Dr. Servando Snare, GI as needed.  Continue to take medications as prescribed.   Increase activity slowly   Complete by: As directed       Allergies as of 09/16/2021       Reactions   Hydrocodone-acetaminophen Nausea And Vomiting  Oxycodone-acetaminophen Rash   Cephalexin    REACTION: Itchy, eyes swelling.   Oxycodone-acetaminophen    REACTION: Nausea, vomiting and rash   Codeine Rash   REACTION: Nausea, vomiting and rash        Medication List     STOP taking these medications    ciprofloxacin 500 MG tablet Commonly known as: CIPRO   HYDROmorphone 2 MG tablet Commonly known as: DILAUDID   lansoprazole 30 MG capsule Commonly known as: PREVACID   PARoxetine 20 MG  tablet Commonly known as: PAXIL   predniSONE 10 MG tablet Commonly known as: DELTASONE       TAKE these medications    ALPRAZolam 1 MG tablet Commonly known as: XANAX Take 1 mg by mouth 3 (three) times daily as needed for sleep or anxiety.   desipramine 25 MG tablet Commonly known as: Norpramin Take 1 tablet (25 mg total) by mouth at bedtime.   dicyclomine 10 MG capsule Commonly known as: BENTYL Take 10-20 mg by mouth 4 (four) times daily as needed (abdominal pain).   feeding supplement Liqd Take 237 mLs by mouth 3 (three) times daily between meals for 10 days.   FLUoxetine 10 MG capsule Commonly known as: PROZAC Take 10 mg by mouth daily.   multivitamin with minerals Tabs tablet Take 1 tablet by mouth daily. Start taking on: September 17, 2021   ondansetron 8 MG disintegrating tablet Commonly known as: Zofran ODT Take 1 tablet (8 mg total) by mouth every 8 (eight) hours as needed for nausea or vomiting.   pantoprazole 40 MG tablet Commonly known as: PROTONIX Take 1 tablet (40 mg total) by mouth 2 (two) times daily before a meal. What changed: when to take this   promethazine-dextromethorphan 6.25-15 MG/5ML syrup Commonly known as: PROMETHAZINE-DM Take 5 mLs by mouth 4 (four) times daily as needed for cough.   sucralfate 1 g tablet Commonly known as: CARAFATE Take 1 g by mouth 4 (four) times daily as needed.        Follow-up Information     Dortha Kern, MD Follow up in 2 week(s).   Specialty: Family Medicine Contact information: [address] [place] [address] [phone]         Midge Minium, MD. Schedule an appointment as soon as possible for a visit.   Specialty: Gastroenterology Why: If symptoms worsen Contact information: [address] [place]  [place] [address] [phone]                  Time coordinating discharge: 39 minutes  Signed:  Alexei Doswell  [facility] 09/16/2021, 2:54 PM

## 2021-09-16 NOTE — Progress Notes (Signed)
Initial Nutrition Assessment  DOCUMENTATION CODES:   Not applicable  INTERVENTION:   -Ensure Enlive po TID, each supplement provides 350 kcal and 20 grams of protein  -MVI with minerals daily  NUTRITION DIAGNOSIS:   Inadequate oral intake related to poor appetite as evidenced by per patient/family report, meal completion < 25%.  GOAL:   Patient will meet greater than or equal to 90% of their needs  MONITOR:   PO intake, Supplement acceptance, Labs, Weight trends, Skin, I & O's  REASON FOR ASSESSMENT:   Malnutrition Screening Tool    ASSESSMENT:   Kim Mayo is a 45 y.o. female with medical history significant of GERD, depression, anxiety, s/p for cholecystectomy, tension headache, who presents with nausea, vomiting, diarrhea and epigastric abdominal pain.  Pt admitted with epigastric abdominal pain due to gastritis without bleeding.   9/23- s/p EGD- revealed gastritis (biopsied)  Reviewed I/O's: +531 ml x 24 hours  Pt unavailable at time of visit. Attempted to speak with pt via call to hospital room phone, however, unable to reach. RD unable to obtain further nutrition-related history or complete nutrition-focused physical exam at this time.    Per H&P, pt complains of poor appetite and decreased oral intake since January 2022 secondary to COVID-19 infection.   Per MD notes, pt is tolerating liquids, but not solid food today. Noted documented meal completions 25%.  Reviewed wt hx; pt has experienced a 21% wt loss over the past month, which is significant for time frame.   Pt at high risk for malnutrition due to recent significant wt loss and history of poor oral intake, however, unable to identify at this time. Pt would greatly benefit from addition of oral nutrition supplements.   Medications reviewed and include prednisone and 0.9% sodium chloride infusion @ 125 ml/hr.  Labs reviewed: CBGS: 81-91.    Diet Order:   Diet Order             Diet regular Room  service appropriate? Yes; Fluid consistency: Thin  Diet effective now                   EDUCATION NEEDS:   No education needs have been identified at this time  Skin:  Skin Assessment: Reviewed RN Assessment  Last BM:  09/14/21  Height:   Ht Readings from Last 1 Encounters:  09/15/21 5\' 5"  (1.651 m)    Weight:   Wt Readings from Last 1 Encounters:  09/15/21 74.4 kg    Ideal Body Weight:  56.8 kg  BMI:  Body mass index is 27.29 kg/m.  Estimated Nutritional Needs:   Kcal:  1950-2150  Protein:  110-125 grams  Fluid:  > 1.9 L    09/17/21, RD, LDN, CDCES Registered Dietitian II Certified Diabetes Care and Education Specialist Please refer to [facility] for RD and/or RD on-call/weekend/after hours pager

## 2021-09-18 ENCOUNTER — Encounter: Payer: Self-pay | Admitting: Gastroenterology

## 2021-09-18 LAB — URINE CULTURE: Culture: >=100000 — AB

## 2021-09-19 LAB — SURGICAL PATHOLOGY

## 2021-09-25 ENCOUNTER — Ambulatory Visit: Admission: RE | Admit: 2021-09-25 | Payer: Medicaid Other | Source: Home / Self Care

## 2021-09-25 ENCOUNTER — Encounter: Admission: RE | Payer: Self-pay | Source: Home / Self Care

## 2021-09-25 SURGERY — EGD (ESOPHAGOGASTRODUODENOSCOPY)
Anesthesia: General

## 2021-10-02 ENCOUNTER — Encounter: Payer: Self-pay | Admitting: Gastroenterology

## 2022-04-11 ENCOUNTER — Ambulatory Visit: Payer: Self-pay

## 2022-04-11 ENCOUNTER — Telehealth: Payer: Self-pay

## 2022-04-11 NOTE — Telephone Encounter (Signed)
Pt called really concerned about being 46yr old and pregnant.  I adv pts that she is not alone; we have older pts being pregnant.  Pt was on my schedule for a NOB Intake.  I gave the pt the option of me actually doing the intake or did she want to schedule an appointment with a provider to discuss the ramifications of her age and pregnancy.  Pt stated she thought that was a great idea.  Pt tx'd to [facility] for scheduling of pregnancy consult. ?

## 2022-04-11 NOTE — Initial Assessments (Deleted)
New OB Intake ? ?I connected with  RAMINA SACCHETTI on 04/11/22 at  2:15 PM EDT by {Contact:[id number]} Telephone Visit and verified that I am speaking with the correct person using two identifiers. Nurse is located at [facility] and pt is located at ***. ? ?I explained I am completing New OB Intake today. We discussed her EDD of *** that is based on LMP of ***. Pt is G***/P***. I reviewed her allergies, medications, Medical/Surgical/OB history, and appropriate screenings. I informed her of [facility] services. Based on history, this is a/an  pregnancy {Complicated/Uncomplicated [id number] .  ? ?Patient Active Problem List  ? Diagnosis Date Noted  ? Epigastric abdominal pain 09/15/2021  ? Nausea vomiting and diarrhea 09/15/2021  ? GERD (gastroesophageal reflux disease) 09/15/2021  ? 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 Hypokalemia 09/15/2021  ? Gastritis without bleeding   ? 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 Cholecystitis 08/07/2015  ? Gallstones   ? Cholelithiases   ? HEADACHE, TENSION 07/06/2009  ? ? ?Concerns addressed today ? ?Delivery Plans:  ?Plans to deliver at [facility]  ? ? [place] ?Explained first scheduled [place] will be scheduled at her first visit with provider. ?Labs ?Pt aware NOB labs will be drawn at first visit with provider. ? ?Patient {HAS/HAS NOT:20194} covid vaccine.   ? ?Social Determinants of Health ?Food Insecurity: {food:25541}  ?Transportation: {transportation:25542} ? ?Placed OB Box on problem list and updated ? ?First visit review ?I reviewed new OB appt with pt. I explained she will have a pelvic exam, ob bloodwork with genetic screening, and PAP smear. Explained pt will be seen by *** at first visit; encounter routed to appropriate provider.   ?Cleophas Dunker, [facility]) ?04/11/2022  1:48 PM  ?

## 2022-04-13 ENCOUNTER — Ambulatory Visit (INDEPENDENT_AMBULATORY_CARE_PROVIDER_SITE_OTHER): Payer: Medicaid Other | Admitting: Obstetrics

## 2022-04-13 VITALS — BP 128/82 | Wt 185.0 lb

## 2022-04-13 DIAGNOSIS — N926 Irregular menstruation, unspecified: Secondary | ICD-10-CM

## 2022-04-13 LAB — POCT URINE PREGNANCY: Preg Test, Ur: POSITIVE — AB

## 2022-04-13 MED ORDER — FLUOXETINE HCL 10 MG PO CAPS: 10.0000 mg | ORAL_CAPSULE | Freq: Every day | ORAL | 11 refills | Status: DC

## 2022-04-13 NOTE — Progress Notes (Signed)
? ? ?Obstetrics & Gynecology Office Visit  ? ?Chief Complaint: No chief complaint on file. ? ? ?History of Present Illness: Kim Mayo is a tearful 46 year old who presents with a report of positive home pregnancy testing. She has had several positive tests at home and reports an LMP of 02/21/2022. She was not contracepting. Had recently ended a longterm relationship. 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 ?She shares her tremendous surprise at finding herself pregnant. Her medical hx includes anxiety and depression, for which she has used Xanax and Prozac in the past.She stopped those medications once she tested positive at home, and has made an appointment at [facility] for confirmation and for counsel.She has been struggling emotionally with this news and after stopping her medication. ?She has begun having nausea symptoms, and some cramping. No vaginal bleeding or LOF.  ?  ?Review of Systems:  ?Review of Systems  ?Constitutional: Negative.   ?HENT: Negative.    ?Eyes: Negative.   ?Respiratory: Negative.    ?Cardiovascular: Negative.   ?Gastrointestinal:  Positive for heartburn.  ?Genitourinary: Negative.   ?Musculoskeletal: Negative.   ?Skin: Negative.   ?Neurological: Negative.   ?Endo/Heme/Allergies: Negative.   ?Psychiatric/Behavioral:  Positive for depression. The patient is nervous/anxious.    ? ?Past Medical History:  ?Past Medical History:  ?Diagnosis Date  ? Anxiety   ? Bronchitis   ? Depression   ? History of preterm labor   ? ? ?Past Surgical History:  ?Past Surgical History:  ?Procedure Laterality Date  ? APPENDECTOMY    ? BREAST SURGERY    ? CESAREAN SECTION  08/01/2004  ? second c/s 01/08/12  ? CHOLECYSTECTOMY N/A 08/07/2015  ? Procedure: LAPAROSCOPIC CHOLECYSTECTOMY WITH INTRAOPERATIVE CHOLANGIOGRAM;  Surgeon: Tiney Rouge III, MD;  Location: [facility] ORS;  Service: General;  Laterality:  N/A;  ? ESOPHAGOGASTRODUODENOSCOPY (EGD) WITH PROPOFOL N/A 09/15/2021  ? Procedure: ESOPHAGOGASTRODUODENOSCOPY (EGD) WITH PROPOFOL;  Surgeon: Midge Minium, MD;  Location: [facility] ENDOSCOPY;  Service: Endoscopy;  Laterality: N/A;  ? ? ?Gynecologic History: Patient's last menstrual period was 08/30/2021 (approximate). ? ?Obstetric History: [id number] ? ?Family History:  ?Family History  ?Problem Relation Age of Onset  ? Cancer Mother 64  ?     brain  ? Heart disease Father   ? Hypertension Father   ? Thyroid disease Father   ? Leukemia Cousin   ? Leukemia Cousin   ? ? ?Social History:  ?Social History  ? ?Socioeconomic History  ? Marital status: Single  ?  Spouse name: Not on file  ? Number of children: 3  ? Years of education: 26  ? Highest education level: Not on file  ?Occupational History  ? Occupation: office administration  ?Tobacco Use  ? Smoking status: Never  ? Smokeless tobacco: Never  ?Vaping Use  ? Vaping Use: Never used  ?Substance and Sexual Activity  ? Alcohol use: Not Currently  ? Drug use: No  ? Sexual activity: Yes  ?Other Topics Concern  ? Not on file  ?Social History Narrative  ? Not on file  ? ?Social Determinants of Health  ? ?Financial Resource Strain: Not on file  ?Food Insecurity: Not on file  ?Transportation Needs: Not on file  ?Physical Activity: Not on file  ?Stress: Not on file  ?Social Connections: Not on file  ?Intimate Partner Violence: Not on file  ? ? ?Allergies:  ?Allergies  ?  Allergen Reactions  ? Hydrocodone-Acetaminophen Nausea And Vomiting  ? Oxycodone-Acetaminophen Rash  ? Cephalexin   ?  REACTION: Itchy, eyes swelling.  ? Oxycodone-Acetaminophen   ?  REACTION: Nausea, vomiting and rash  ? Codeine Rash  ?  REACTION: Nausea, vomiting and rash  ? ? ?Medications: ?Prior to Admission medications   ?Medication Sig Start Date End Date Taking? Authorizing Provider  ?ondansetron (ZOFRAN ODT) 8 MG disintegrating tablet Take 1 tablet (8 mg total) by mouth every 8 (eight) hours as needed for  nausea or vomiting. 09/13/21  Yes Eusebio Friendly B, PA-C  ?dicyclomine (BENTYL) 10 MG capsule Take 10-20 mg by mouth 4 (four) times daily as needed (abdominal pain). ?Patient not taking: Reported on 04/13/2022 06/27/21   [provider]  ?FLUoxetine (PROZAC) 10 MG capsule Take 1 capsule (10 mg total) by mouth daily. 04/13/22   Mirna Mires, CNM  ?pantoprazole (PROTONIX) 40 MG tablet Take 1 tablet (40 mg total) by mouth 2 (two) times daily before a meal. 09/16/21 10/16/21  Pokhrel, Rebekah Chesterfield, MD  ?promethazine-dextromethorphan (PROMETHAZINE-DM) 6.25-15 MG/5ML syrup Take 5 mLs by mouth 4 (four) times daily as needed for cough. ?Patient not taking: Reported on 04/13/2022 09/13/21   Shirlee Latch, PA-C  ?sucralfate (CARAFATE) 1 g tablet Take 1 g by mouth 4 (four) times daily as needed. ?Patient not taking: Reported on 04/13/2022 06/01/21   [provider]  ? ? ?Physical Exam ?Vitals:  ?Vitals:  ? 04/13/22 1549  ?BP: 128/82  ? ?Patient's last menstrual period was 08/30/2021 (approximate). ? ?Physical Exam ?Constitutional:   ?   Appearance: Normal appearance. She is normal weight.  ?HENT:  ?   Head: Normocephalic and atraumatic.  ?Cardiovascular:  ?   Rate and Rhythm: Normal rate and regular rhythm.  ?Pulmonary:  ?   Effort: Pulmonary effort is normal.  ?   Breath sounds: Normal breath sounds.  ?Musculoskeletal:  ?   Cervical back: Normal range of motion and neck supple.  ?Neurological:  ?   General: No focal deficit present.  ?   Mental Status: She is oriented to person, place, and time.  ?Psychiatric:  ?   Comments: Visibly upset, and crying throughout visit.  ? ? ? ?Assessment: 46 y.o. [phone] with missed period and positive pregnancy test ?Undecided re proceeding with pregnancy ?Would be AMA, higher risk ?Hx of anxiety and depression. ? ?Plan: ?Problem List Items Addressed This Visit   ? ?  ? Other  ? Missed period - Primary  ? Relevant Orders  ? POCT urine pregnancy (Completed)  ? Beta hCG quant (ref lab)   ? US OB Comp Less 14 Wks  ?Additional time today spent listening to patient and providing emotional support ?Quantitative Hcg ordered today (Urine pregnancy test here is positive) ?Dating scan ordered to be done ASAP ?Will follow up with visit after the dating scan.  ?Encouraged patient to seek counsel for friends and any pregnancy counseling services. ?She will resume her Fluoxetine today after discussing SSRI med in pregnancy. ? ?Mirna Mires, CNM  ?04/13/2022 4:58 PM  ? ? ? ? ? ?

## 2022-04-14 LAB — BETA HCG QUANT (REF LAB): hCG Quant: 34163 m[IU]/mL

## 2022-04-16 ENCOUNTER — Encounter: Payer: Self-pay | Admitting: Obstetrics

## 2022-04-16 ENCOUNTER — Other Ambulatory Visit: Payer: Self-pay | Admitting: Obstetrics

## 2022-04-16 DIAGNOSIS — O219 Vomiting of pregnancy, unspecified: Secondary | ICD-10-CM

## 2022-04-16 MED ORDER — PROMETHAZINE HCL 25 MG PO TABS: 25.0000 mg | ORAL_TABLET | Freq: Four times a day (QID) | ORAL | 0 refills | Status: DC | PRN

## 2022-04-16 NOTE — Progress Notes (Signed)
Patient's quantitative resluts indicate approximately [redacted] weeks gestation. Called and discussed this with the pateint. Seh is having a dating scan in two days. Feeling very nauseous- Rx for limited Phenergan sent for her as the Zofran is not helping. ?Imagene Riches, CNM  ?04/16/2022 5:35 PM  ?  ?

## 2022-04-17 NOTE — Telephone Encounter (Signed)
I contacted patient via phone. Patient is scheduled for 04/19/22 with [facility]. Patient is aware

## 2022-04-19 ENCOUNTER — Other Ambulatory Visit: Payer: Self-pay | Admitting: Obstetrics

## 2022-04-19 ENCOUNTER — Ambulatory Visit
Admission: RE | Admit: 2022-04-19 | Discharge: 2022-04-19 | Disposition: A | Discharge status: Home / Self Care | Payer: Medicaid Other | Source: Ambulatory Visit | Attending: Obstetrics | Admitting: Obstetrics

## 2022-04-19 DIAGNOSIS — N926 Irregular menstruation, unspecified: Secondary | ICD-10-CM

## 2022-04-24 ENCOUNTER — Encounter: Payer: Self-pay | Admitting: Family Medicine

## 2022-04-24 ENCOUNTER — Ambulatory Visit (INDEPENDENT_AMBULATORY_CARE_PROVIDER_SITE_OTHER): Payer: Medicaid Other | Admitting: Family Medicine

## 2022-04-24 VITALS — BP 140/90 | Ht 65.0 in | Wt 185.0 lb

## 2022-04-24 DIAGNOSIS — O219 Vomiting of pregnancy, unspecified: Secondary | ICD-10-CM

## 2022-04-24 DIAGNOSIS — O4690 Antepartum hemorrhage, unspecified, unspecified trimester: Secondary | ICD-10-CM | POA: Diagnosis not present

## 2022-04-24 MED ORDER — ONDANSETRON 8 MG PO TBDP: 8.0000 mg | ORAL_TABLET | Freq: Three times a day (TID) | ORAL | 0 refills | Status: DC | PRN

## 2022-04-24 MED ORDER — METOCLOPRAMIDE HCL 10 MG PO TABS: 10.0000 mg | ORAL_TABLET | Freq: Three times a day (TID) | ORAL | 2 refills | Status: DC

## 2022-04-24 MED ORDER — PROMETHAZINE HCL 25 MG PO TABS: 25.0000 mg | ORAL_TABLET | Freq: Four times a day (QID) | ORAL | 1 refills | Status: DC | PRN

## 2022-04-24 NOTE — Progress Notes (Signed)
?  Amenorrhea with positive UPT ?PROBLEM  VISIT ENCOUNTER NOTE ? ?Subjective:  ? Kim Mayo is a 46 y.o.  [phone] female here for positive pregnancy test.  ? ?She has delivered 3 children-- SVD-15yrs, CS-74yr and another CS- 10 yrs. Reports  bleeding today, showed me pictures of bloody toilet paper. Mild cramping. Having severe nausea/vomiting. ? ?Lab results ?bHCG 34,163 on 4/21  ? ?[place] on 4/27 ?IMPRESSION: ?Single live intrauterine gestation at 7 weeks 1 day EGA by ?crown-rump length. ? Small subchronic hemorrhage. ? ? ?Reports vaginal bleeding per above. Denies discharge, pelvic pain, problems with intercourse or other gynecologic concerns.  ?  ?Gynecologic History ?Patient's last menstrual period was 08/30/2021 (approximate). ? ?Health Maintenance Due  ?Topic Date Due  ? COVID-19 Vaccine (1) Never done  ? Hepatitis C Screening  Never done  ? PAP SMEAR-Modifier  Never done  ? TETANUS/TDAP  12/24/2010  ? COLONOSCOPY (Pts 45-59yrs Insurance coverage will need to be confirmed)  Never done  ? ? ?The following portions of the patient's history were reviewed and updated as appropriate: allergies, current medications, past family history, past medical history, past social history, past surgical history and problem list. ? ?Review of Systems ?Pertinent items are noted in HPI. ?  ?Objective:  ?BP 140/90   Ht 5\' 5"  (1.651 m)   Wt 185 lb (83.9 kg)   LMP 08/30/2021 (Approximate)   BMI 30.79 kg/m?  ?Gen: well appearing, NAD ?HEENT: no scleral icterus ?CV: RR ?Lung: Normal WOB ?Ext: warm well perfused ? ?Bedside US ?Performed bedside US given bleeding.  ?IUP with FHR 160 - normal pulse wave, consistent with 8wk  ? ?Assessment and Plan:  ?1. Nausea and vomiting in pregnancy ?Reviewed medication and using reglan for prevention and phenergan and zofran as well prn ?- promethazine (PHENERGAN) 25 MG tablet; Take 1 tablet (25 mg total) by mouth every 6 (six) hours as needed for nausea or vomiting.  Dispense: 30 tablet;  Refill: 1 ?- metoCLOPramide (REGLAN) 10 MG tablet; Take 1 tablet (10 mg total) by mouth 3 (three) times daily before meals.  Dispense: 90 tablet; Refill: 2 ? ?2. Vaginal bleeding during pregnancy ?Discussed viable pregnancy today ?Patient is unsure about whether she wants to keep this pregnancy.  ?D[facility].  ?Provided support to patient about her complex decision.  ?Reviewed also early NIP and early [place] at 32 wks for genetic screening ?Patient will think about options ?Accepts appt today for early initial prenatal and NIPS. Strongly recommend early [place] and MFM consult with genetic screening. Consider [facility] or Amniocentesis if patient decides to continue with pregnancy.  ? ? ?Please refer to After Visit Summary for other counseling recommendations.  ? ?Return in about 13 days (around 05/07/2022) for Initial prenatal visit-- Desires NIP . ?Future Appointments  ?Date Time Provider [place]  ?05/09/2022  9:55 AM Constant, Vickii Chafe, MD [facility] None  ? ? ? ?Caren Macadam, MD, MPH, ABFM ?Attending Physician ?[place] for [place]  ? ?

## 2022-04-27 ENCOUNTER — Encounter: Payer: Self-pay | Admitting: Family Medicine

## 2022-04-30 ENCOUNTER — Ambulatory Visit: Payer: Medicaid Other | Admitting: Family Medicine

## 2022-05-02 ENCOUNTER — Telehealth: Payer: Self-pay

## 2022-05-02 ENCOUNTER — Other Ambulatory Visit: Payer: Self-pay

## 2022-05-02 ENCOUNTER — Emergency Department
Admission: EM | Admit: 2022-05-02 | Discharge: 2022-05-02 | Disposition: A | Discharge status: Home / Self Care | Payer: Medicaid Other | Attending: Emergency Medicine | Admitting: Emergency Medicine

## 2022-05-02 DIAGNOSIS — E876 Hypokalemia: Secondary | ICD-10-CM | POA: Diagnosis not present

## 2022-05-02 DIAGNOSIS — R638 Other symptoms and signs concerning food and fluid intake: Secondary | ICD-10-CM | POA: Insufficient documentation

## 2022-05-02 DIAGNOSIS — Z3A09 9 weeks gestation of pregnancy: Secondary | ICD-10-CM | POA: Insufficient documentation

## 2022-05-02 DIAGNOSIS — N939 Abnormal uterine and vaginal bleeding, unspecified: Secondary | ICD-10-CM | POA: Diagnosis not present

## 2022-05-02 DIAGNOSIS — O210 Mild hyperemesis gravidarum: Secondary | ICD-10-CM | POA: Insufficient documentation

## 2022-05-02 DIAGNOSIS — R1013 Epigastric pain: Secondary | ICD-10-CM | POA: Insufficient documentation

## 2022-05-02 DIAGNOSIS — R519 Headache, unspecified: Secondary | ICD-10-CM | POA: Insufficient documentation

## 2022-05-02 LAB — COMPREHENSIVE METABOLIC PANEL
ALT: 10 U/L (ref 0–44)
AST: 14 U/L — ABNORMAL LOW (ref 15–41)
Albumin: 3.8 g/dL (ref 3.5–5.0)
Alkaline Phosphatase: 45 U/L (ref 38–126)
Anion gap: 9 (ref 5–15)
BUN: 7 mg/dL (ref 6–20)
CO2: 24 mmol/L (ref 22–32)
Calcium: 8.5 mg/dL — ABNORMAL LOW (ref 8.9–10.3)
Chloride: 99 mmol/L (ref 98–111)
Creatinine, Ser: 0.86 mg/dL (ref 0.44–1.00)
GFR, Estimated: >60 mL/min (ref >60)
Glucose, Bld: 95 mg/dL (ref 70–99)
Potassium: 3.4 mmol/L — ABNORMAL LOW (ref 3.5–5.1)
Sodium: 132 mmol/L — ABNORMAL LOW (ref 135–145)
Total Bilirubin: 0.5 mg/dL (ref 0.3–1.2)
Total Protein: 6.9 g/dL (ref 6.5–8.1)

## 2022-05-02 LAB — CBC WITH DIFFERENTIAL/PLATELET
Abs Immature Granulocytes: 0.02 10*3/uL (ref 0.00–0.07)
Basophils Absolute: 0 10*3/uL (ref 0.0–0.1)
Basophils Relative: 0 %
Eosinophils Absolute: 0.1 10*3/uL (ref 0.0–0.5)
Eosinophils Relative: 2 %
HCT: 37.8 % (ref 36.0–46.0)
Hemoglobin: 13 g/dL (ref 12.0–15.0)
Immature Granulocytes: 0 %
Lymphocytes Relative: 19 %
Lymphs Abs: 1.5 10*3/uL (ref 0.7–4.0)
MCH: 31.6 pg (ref 26.0–34.0)
MCHC: 34.4 g/dL (ref 30.0–36.0)
MCV: 92 fL (ref 80.0–100.0)
Monocytes Absolute: 0.4 10*3/uL (ref 0.1–1.0)
Monocytes Relative: 5 %
Neutro Abs: 5.7 10*3/uL (ref 1.7–7.7)
Neutrophils Relative %: 74 %
Platelets: 256 10*3/uL (ref 150–400)
RBC: 4.11 MIL/uL (ref 3.87–5.11)
RDW: 13 % (ref 11.5–15.5)
WBC: 7.7 10*3/uL (ref 4.0–10.5)
nRBC: 0 % (ref 0.0–0.2)

## 2022-05-02 LAB — URINALYSIS, ROUTINE W REFLEX MICROSCOPIC
Bilirubin Urine: NEGATIVE
Glucose, UA: NEGATIVE mg/dL
Ketones, ur: NEGATIVE mg/dL
Leukocytes,Ua: NEGATIVE
Nitrite: NEGATIVE
Protein, ur: NEGATIVE mg/dL
Specific Gravity, Urine: 1.004 — ABNORMAL LOW (ref 1.005–1.030)
pH: 6 (ref 5.0–8.0)

## 2022-05-02 MED ORDER — FAMOTIDINE IN NACL 20-0.9 MG/50ML-% IV SOLN
20.0000 mg | Freq: Once | INTRAVENOUS | Status: AC
  Administered 2022-05-02: 20 mg via INTRAVENOUS
  Filled 2022-05-02: qty 50

## 2022-05-02 MED ORDER — SODIUM CHLORIDE 0.9 % IV BOLUS
1000.0000 mL | Freq: Once | INTRAVENOUS | Status: AC
  Administered 2022-05-02: 1000 mL via INTRAVENOUS

## 2022-05-02 MED ORDER — DIPHENHYDRAMINE HCL 50 MG/ML IJ SOLN
25.0000 mg | Freq: Once | INTRAMUSCULAR | Status: AC
  Administered 2022-05-02: 25 mg via INTRAVENOUS
  Filled 2022-05-02: qty 1

## 2022-05-02 MED ORDER — METOCLOPRAMIDE HCL 5 MG/ML IJ SOLN
10.0000 mg | Freq: Once | INTRAMUSCULAR | Status: AC
  Administered 2022-05-02: 10 mg via INTRAVENOUS
  Filled 2022-05-02: qty 2

## 2022-05-02 MED ORDER — ACETAMINOPHEN 500 MG PO TABS
1000.0000 mg | ORAL_TABLET | Freq: Once | ORAL | Status: AC
  Administered 2022-05-02: 1000 mg via ORAL
  Filled 2022-05-02: qty 2

## 2022-05-02 MED ORDER — POTASSIUM CHLORIDE CRYS ER 20 MEQ PO TBCR: 40.0000 meq | EXTENDED_RELEASE_TABLET | Freq: Once | ORAL | Status: DC

## 2022-05-02 NOTE — Telephone Encounter (Signed)
Pt left msg on triage saying she has been throwing up for more than 24 hrs, feels very weak. Checked with Crystal first to confirm, pt needs to go to ED since its been more than 24 hrs, someone should drive her. Pt aware. ?

## 2022-05-02 NOTE — ED Provider Notes (Signed)
? ?[facility] ?Provider Note ? ? ? Event Date/Time  ? First MD Initiated Contact with Patient 05/02/22 1454   ?  (approximate) ? ? ?History  ? ?Emesis During Pregnancy ? ? ?HPI ? ?Kim Mayo is a 46 y.o. female  [phone] with pmh depression presents with vomiting.  Patient is about [redacted] weeks pregnant.  She is following with [facility].  Has had morning sickness with nausea vomiting during this pregnancy is prescribed Phenergan Zofran and Reglan.  Over the last 3 days her vomiting has been worse has been barely able to tolerate p.o.  Has had multiple episodes of nonbloody nonbilious emesis.  Does have some epigastric discomfort after vomiting.  Did have some mild lower abdominal cramping yesterday but this is largely resolved denies fevers urinary symptoms vaginal bleeding or abnormal discharge.  Does have mild headache currently. ? ?  ? ?Past Medical History:  ?Diagnosis Date  ? Anxiety   ? Bronchitis   ? Depression   ? History of preterm labor   ? ? ?Patient Active Problem List  ? Diagnosis Date Noted  ? Missed period 04/13/2022  ? 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 Gallstones   ? HEADACHE, TENSION 07/06/2009  ? ? ? ?Physical Exam  ?Triage Vital Signs: ?ED Triage Vitals [05/02/22 1417]  ?Enc Vitals Group  ?   BP 126/87  ?   Pulse Rate 77  ?   Resp 18  ?   Temp 98.3 ?F (36.8 ?C)  ?   Temp Source Oral  ?   SpO2 100 %  ?   Weight   ?   Height   ?   Head Circumference   ?   Peak Flow   ?   Pain Score   ?   Pain Loc   ?   Pain Edu?   ?   Excl. in GC?   ? ? ?Most recent vital signs: ?Vitals:  ? 05/02/22 1417  ?BP: 126/87  ?Pulse: 77  ?Resp: 18  ?Temp: 98.3 ?F (36.8 ?C)  ?SpO2: 100%  ? ? ? ?General: Awake, no distress.  ?CV:  Good peripheral perfusion.  ?Resp:  Normal effort.  ?Abd:  No distention.  Abdomen is soft and nontender throughout ?Neuro:             Awake, Alert, Oriented x 3  ?Other:  Dry mucous membranes ? ?Aox3, nml speech  ?PERRL, EOMI, face  symmetric, nml tongue movement  ?5/5 strength in the BL upper and lower extremities  ?Sensation grossly intact in the BL upper and lower extremities  ?Finger-nose-finger intact BL ? ?ED Results / Procedures / Treatments  ?Labs ?(all labs ordered are listed, but only abnormal results are displayed) ?Labs Reviewed  ?COMPREHENSIVE METABOLIC PANEL - Abnormal; Notable for the following components:  ?    Result Value  ? Sodium 132 (*)   ? Potassium 3.4 (*)   ? Calcium 8.5 (*)   ? AST 14 (*)   ? All other components within normal limits  ?CBC WITH DIFFERENTIAL/PLATELET  ?URINALYSIS, ROUTINE W REFLEX MICROSCOPIC  ? ? ? ?EKG ? ? ? ? ?RADIOLOGY ? ? ? ?PROCEDURES: ? ?Critical Care performed: No ? ?Procedures ? ? ? ?MEDICATIONS ORDERED IN ED: ?Medications  ?sodium chloride 0.9 % bolus 1,000 mL (has no administration in time range)  ?metoCLOPramide (REGLAN) injection 10 mg (has no administration in time range)  ?diphenhydrAMINE (BENADRYL) injection 25 mg (has no  administration in time range)  ?famotidine (PEPCID) IVPB 20 mg premix (has no administration in time range)  ? ? ? ?IMPRESSION / MDM / ASSESSMENT AND PLAN / ED COURSE  ?I reviewed the triage vital signs and the nursing notes. ?             ?               ? ?Differential diagnosis includes, but is not limited to, hyperemesis, gastroenteritis electrolyte abnormality dehydration AKI ? ?Patient is a 46 year old female who is approximately [redacted] weeks pregnant presents with multiple episodes of vomiting inability to tolerate p.o.  Has had issues with vomiting and nausea during this pregnancy is on multiple antiemetics prescribed by her OB/GYN.  Over the last 3 days has had difficulty tolerating p.o.  Patient's vitals are within normal limits she is nontoxic but does appear dry abdomen is benign.  Labs are notable for just mild hypokalemia with a potassium of 3.4 no leukocytosis.  Low suspicion for acute intra-abdominal process I suspect this is hyperemesis.  We will place an IV  and give a liter of fluid Reglan Benadryl and Pepcid. ? ?  ?After Reglan and Benadryl Pepcid patient's nausea is improved.  She is tolerating p.o.  Does still complain of mild headache so we will give Tylenol.  Neurologic exam is normal I have low suspicion for cerebral venous sinus thrombosis meningitis or other serious pathology suspect that her headache is in the setting of dehydration we will give another liter of fluid.  UA does not have ketones not evidence of infection. ? ?Patient getting up to use the bathroom prior to discharge did have some light vaginal spotting.  She has had vaginal spotting during this pregnancy was told she had a subchorionic hemorrhage.  I performed a bedside ultrasound and there is an IUP with fetal heart rate that appears to be around 140 bpm and has fetal movement.  We discussed return for heavy bleeding fever or worsening symptoms.  She will follow-up with her OB/GYN. ? ?FINAL CLINICAL IMPRESSION(S) / ED DIAGNOSES  ? ?Final diagnoses:  ?Hyperemesis gravidarum  ? ? ? ?Rx / DC Orders  ? ?ED Discharge Orders   ? ? None  ? ?  ? ? ? ?Note:  This document was prepared using Dragon voice recognition software and may include unintentional dictation errors. ?  ?Georga Hacking, MD ?05/02/22 1840 ? ?

## 2022-05-02 NOTE — ED Triage Notes (Signed)
Pt states she is [redacted] weeks pregnant having hyperemesis, states OB has prescribed several meds with no relief. States she is not able to keep anything down ?

## 2022-05-03 ENCOUNTER — Telehealth: Payer: Self-pay

## 2022-05-03 NOTE — Telephone Encounter (Signed)
Kim Mayo called in where she was seen in the ED yesterday she is dehydrated and she's bleeding. ED advised her to follow up with [place]. She is scheduled next week May 17th but she didn't know if she needed to bee seen sooner? ?And she's still bleeding today. ?

## 2022-05-05 NOTE — Telephone Encounter (Signed)
Left a VM for the patient in response to her cal to the office. Seen at the ED for hyperemesis an treated with IV fluids and antiemetics. Having some vaginal bleeding- I told her on VM to go to the ED fi the bleeding increases,; otherwise she can call the office first thing on Monday morning and ask for a visit. ?Imagene Riches, CNM  ?05/05/2022 11:41 AM  ? ?

## 2022-05-09 ENCOUNTER — Other Ambulatory Visit: Payer: Self-pay | Admitting: Obstetrics

## 2022-05-09 ENCOUNTER — Other Ambulatory Visit (HOSPITAL_COMMUNITY)
Admission: RE | Admit: 2022-05-09 | Discharge: 2022-05-09 | Disposition: A | Discharge status: Home / Self Care | Payer: Medicaid Other | Source: Ambulatory Visit | Attending: Obstetrics and Gynecology | Admitting: Obstetrics and Gynecology

## 2022-05-09 ENCOUNTER — Ambulatory Visit (INDEPENDENT_AMBULATORY_CARE_PROVIDER_SITE_OTHER): Payer: Medicaid Other | Admitting: Obstetrics and Gynecology

## 2022-05-09 ENCOUNTER — Ambulatory Visit
Admission: RE | Admit: 2022-05-09 | Discharge: 2022-05-09 | Disposition: A | Discharge status: Home / Self Care | Payer: Medicaid Other | Source: Ambulatory Visit | Attending: Obstetrics and Gynecology | Admitting: Obstetrics and Gynecology

## 2022-05-09 ENCOUNTER — Encounter: Payer: Self-pay | Admitting: Obstetrics and Gynecology

## 2022-05-09 DIAGNOSIS — Z3687 Encounter for antenatal screening for uncertain dates: Secondary | ICD-10-CM | POA: Diagnosis present

## 2022-05-09 DIAGNOSIS — O09529 Supervision of elderly multigravida, unspecified trimester: Secondary | ICD-10-CM | POA: Insufficient documentation

## 2022-05-09 DIAGNOSIS — O09521 Supervision of elderly multigravida, first trimester: Secondary | ICD-10-CM

## 2022-05-09 DIAGNOSIS — Z3480 Encounter for supervision of other normal pregnancy, unspecified trimester: Secondary | ICD-10-CM | POA: Insufficient documentation

## 2022-05-09 DIAGNOSIS — O34219 Maternal care for unspecified type scar from previous cesarean delivery: Secondary | ICD-10-CM | POA: Insufficient documentation

## 2022-05-09 DIAGNOSIS — O99210 Obesity complicating pregnancy, unspecified trimester: Secondary | ICD-10-CM | POA: Insufficient documentation

## 2022-05-09 DIAGNOSIS — Z3A09 9 weeks gestation of pregnancy: Secondary | ICD-10-CM | POA: Diagnosis not present

## 2022-05-09 NOTE — Progress Notes (Signed)
?  Subjective:  ? ? Kim Mayo is a [id number] [redacted]w[redacted]d being seen today for her first obstetrical visit.  Her obstetrical history is significant for advanced maternal age and previous c-section x 2 . Patient does intend to breast feed. Pregnancy history fully reviewed. ? ?Patient reports vaginal bleeding a few days ago and improvement in her nausea/emesis with medications. ? ?Vitals:  ? 05/09/22 0950  ?BP: 130/90  ?Weight: 189 lb (85.7 kg)  ?Height: 5\' 5"  (1.651 m)  ? ? ?HISTORY: ?OB History  ?Gravida Para Term Preterm AB Living  ?4 3 2 1   3   ?SAB IAB Ectopic Multiple Live Births  ?        3  ?  ?# Outcome Date GA Lbr Len/2nd Weight Sex Delivery Anes PTL Lv  ?4 Current           ?3 Term 01/08/12   8 lb 12 oz (3.969 kg) M CS-Unspec  N LIV  ?2 Preterm 08/01/04 [redacted]w[redacted]d  6 lb (2.722 kg) M CS-Unspec  Y LIV  ?1 Term 04/09/02   7 lb (3.175 kg) F Vag-Spont EPI N LIV  ? ?Past Medical History:  ?Diagnosis Date  ? Anxiety   ? Bronchitis   ? Depression   ? History of preterm labor   ? ?Past Surgical History:  ?Procedure Laterality Date  ? APPENDECTOMY    ? BREAST SURGERY    ? CESAREAN SECTION  08/01/2004  ? second c/s 01/08/12  ? CHOLECYSTECTOMY N/A 08/07/2015  ? Procedure: LAPAROSCOPIC CHOLECYSTECTOMY WITH INTRAOPERATIVE CHOLANGIOGRAM;  Surgeon: 01/10/12 III, MD;  Location: [facility] ORS;  Service: General;  Laterality: N/A;  ? ESOPHAGOGASTRODUODENOSCOPY (EGD) WITH PROPOFOL N/A 09/15/2021  ? Procedure: ESOPHAGOGASTRODUODENOSCOPY (EGD) WITH PROPOFOL;  Surgeon: Tiney Rouge, MD;  Location: [facility] ENDOSCOPY;  Service: Endoscopy;  Laterality: N/A;  ? ?Family History  ?Problem Relation Age of Onset  ? Cancer Mother 44  ?     brain  ? Heart disease Father   ? Hypertension Father   ? Thyroid disease Father   ? Leukemia Cousin   ? Leukemia Cousin   ? ? ? ?Exam  ? ?Pt informed that the ultrasound is considered a limited OB ultrasound and is not intended to be a complete ultrasound exam.  Patient also informed that the ultrasound is not being  completed with the intent of assessing for fetal or placental anomalies or any pelvic abnormalities.  Explained that the purpose of today?s ultrasound is to assess for  viability.  Patient acknowledges the purpose of the exam and the limitations of the study.  No intrauterine gestational sac or fetal pole visualized ? ? ?  ?Assessment:  ? ? Pregnancy: [facility] ?Patient Active Problem List  ? Diagnosis Date Noted  ? Previous cesarean section complicating pregnancy 05/09/2022  ? Supervision of other normal pregnancy, antepartum 05/09/2022  ? AMA (advanced maternal age) multigravida 35+ 05/09/2022  ? Maternal obesity affecting pregnancy, antepartum 05/09/2022  ? Missed period 04/13/2022  ? 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 Gallstones   ? HEADACHE, TENSION 07/06/2009  ? ?  ? ?  ?Plan:  ? ?  ?Initial labs help. ?Prenatal vitamins. ?Problem list reviewed and updated. ?Patient sent for viability scan at the hospital for confirmation.  ?Patient will be contacted with results  ? ?Kim Mayo ?05/09/2022 ? ? ? ?

## 2022-05-09 NOTE — Progress Notes (Signed)
Addendum: ? ?Patient returns following viability ultrasound which demonstrates a fetal pole without cardiac activity consistent with intrauterine fetal demise measuring [redacted]w[redacted]d,  ?Emotional support provided ?Patient counseled on options of expectant management, medical management with cytotec and surgical management with D&E. Patient opted for D&E ?Patient will be scheduled for the procedure ? ? ?

## 2022-05-09 NOTE — Progress Notes (Signed)
I called this pateint when her ultrasound report came to me on 05/09/2022. She saw Dr. Jolayne Panther in the office today, and then went to the hospital where a scan diagnosed an early MAB. Aftr talking with her this evening, she is requesting a scheduled D and C. I have sent a note to Crystal so that she can expedite this. ? ?Mirna Mires, CNM  ?05/09/2022 7:47 PM  ? ?

## 2022-05-09 NOTE — Patient Instructions (Signed)
First Trimester of Pregnancy  The first trimester of pregnancy starts on the first day of your last menstrual period until the end of week 12. This is months 1 through 3 of pregnancy. A week after a sperm fertilizes an egg, the egg will implant into the wall of the uterus and begin to develop into a baby. By the end of 12 weeks, all the baby's organs will be formed and the baby will be 2-3 inches in size. Body changes during your first trimester Your body goes through many changes during pregnancy. The changes vary and generally return to normal after your baby is born. Physical changes You may gain or lose weight. Your breasts may begin to grow larger and become tender. The tissue that surrounds your nipples (areola) may become darker. Dark spots or blotches (chloasma or mask of pregnancy) may develop on your face. You may have changes in your hair. These can include thickening or thinning of your hair or changes in texture. Health changes You may feel nauseous, and you may vomit. You may have heartburn. You may develop headaches. You may develop constipation. Your gums may bleed and may be sensitive to brushing and flossing. Other changes You may tire easily. You may urinate more often. Your menstrual periods will stop. You may have a loss of appetite. You may develop cravings for certain kinds of food. You may have changes in your emotions from day to day. You may have more vivid and strange dreams. Follow these instructions at home: Medicines Follow your health care provider's instructions regarding medicine use. Specific medicines may be either safe or unsafe to take during pregnancy. Do not take any medicines unless told to by your health care provider. Take a prenatal vitamin that contains at least 600 micrograms (mcg) of folic acid. Eating and drinking Eat a healthy diet that includes fresh fruits and vegetables, whole grains, good sources of protein such as meat, eggs, or tofu,  and low-fat dairy products. Avoid raw meat and unpasteurized juice, milk, and cheese. These carry germs that can harm you and your baby. If you feel nauseous or you vomit: Eat 4 or 5 small meals a day instead of 3 large meals. Try eating a few soda crackers. Drink liquids between meals instead of during meals. You may need to take these actions to prevent or treat constipation: Drink enough fluid to keep your urine pale yellow. Eat foods that are high in fiber, such as beans, whole grains, and fresh fruits and vegetables. Limit foods that are high in fat and processed sugars, such as fried or sweet foods. Activity Exercise only as directed by your health care provider. Most people can continue their usual exercise routine during pregnancy. Try to exercise for 30 minutes at least 5 days a week. Stop exercising if you develop pain or cramping in the lower abdomen or lower back. Avoid exercising if it is very hot or humid or if you are at high altitude. Avoid heavy lifting. If you choose to, you may have sex unless your health care provider tells you not to. Relieving pain and discomfort Wear a good support bra to relieve breast tenderness. Rest with your legs elevated if you have leg cramps or low back pain. If you develop bulging veins (varicose veins) in your legs: Wear support hose as told by your health care provider. Elevate your feet for 15 minutes, 3-4 times a day. Limit salt in your diet. Safety Wear your seat belt at all times when   driving or riding in a car. Talk with your health care provider if someone is verbally or physically abusive to you. Talk with your health care provider if you are feeling sad or have thoughts of hurting yourself. Lifestyle Do not use hot tubs, steam rooms, or saunas. Do not douche. Do not use tampons or scented sanitary pads. Do not use herbal remedies, alcohol, illegal drugs, or medicines that are not approved by your health care provider. Chemicals  in these products can harm your baby. Do not use any products that contain nicotine or tobacco, such as cigarettes, e-cigarettes, and chewing tobacco. If you need help quitting, ask your health care provider. Avoid cat litter boxes and soil used by cats. These carry germs that can cause birth defects in the baby and possibly loss of the unborn baby (fetus) by miscarriage or stillbirth. General instructions During routine prenatal visits in the first trimester, your health care provider will do a physical exam, perform necessary tests, and ask you how things are going. Keep all follow-up visits. This is important. Ask for help if you have counseling or nutritional needs during pregnancy. Your health care provider can offer advice or refer you to specialists for help with various needs. Schedule a dentist appointment. At home, brush your teeth with a soft toothbrush. Floss gently. Write down your questions. Take them to your prenatal visits. Where to find more information American Pregnancy Association: americanpregnancy.org American College of Obstetricians and Gynecologists: acog.org/en/Womens%20Health/Pregnancy Office on Women's Health: womenshealth.gov/pregnancy Contact a health care provider if you have: Dizziness. A fever. Mild pelvic cramps, pelvic pressure, or nagging pain in the abdominal area. Nausea, vomiting, or diarrhea that lasts for 24 hours or longer. A bad-smelling vaginal discharge. Pain when you urinate. Known exposure to a contagious illness, such as chickenpox, measles, Zika virus, HIV, or hepatitis. Get help right away if you have: Spotting or bleeding from your vagina. Severe abdominal cramping or pain. Shortness of breath or chest pain. Any kind of trauma, such as from a fall or a car crash. New or increased pain, swelling, or redness in an arm or leg. Summary The first trimester of pregnancy starts on the first day of your last menstrual period until the end of week  12 (months 1 through 3). Eating 4 or 5 small meals a day rather than 3 large meals may help to relieve nausea and vomiting. Do not use any products that contain nicotine or tobacco, such as cigarettes, e-cigarettes, and chewing tobacco. If you need help quitting, ask your health care provider. Keep all follow-up visits. This is important. This information is not intended to replace advice given to you by your health care provider. Make sure you discuss any questions you have with your health care provider. Document Revised: 05/18/2020 Document Reviewed: 03/24/2020 Elsevier Patient Education  2023 [employer] Inc[employer]  

## 2022-05-10 ENCOUNTER — Ambulatory Visit (INDEPENDENT_AMBULATORY_CARE_PROVIDER_SITE_OTHER): Payer: Medicaid Other | Admitting: Obstetrics and Gynecology

## 2022-05-10 ENCOUNTER — Encounter: Payer: Self-pay | Admitting: Obstetrics and Gynecology

## 2022-05-10 ENCOUNTER — Other Ambulatory Visit (INDEPENDENT_AMBULATORY_CARE_PROVIDER_SITE_OTHER): Payer: Medicaid Other

## 2022-05-10 ENCOUNTER — Other Ambulatory Visit: Payer: Self-pay | Admitting: Obstetrics and Gynecology

## 2022-05-10 ENCOUNTER — Telehealth: Payer: Self-pay | Admitting: Obstetrics and Gynecology

## 2022-05-10 VITALS — BP 110/70 | HR 56 | Ht 65.0 in | Wt 186.0 lb

## 2022-05-10 DIAGNOSIS — O200 Threatened abortion: Secondary | ICD-10-CM | POA: Diagnosis not present

## 2022-05-10 DIAGNOSIS — O021 Missed abortion: Secondary | ICD-10-CM

## 2022-05-10 DIAGNOSIS — Z01818 Encounter for other preprocedural examination: Secondary | ICD-10-CM

## 2022-05-10 DIAGNOSIS — O3680X0 Pregnancy with inconclusive fetal viability, not applicable or unspecified: Secondary | ICD-10-CM

## 2022-05-10 DIAGNOSIS — Z3A09 9 weeks gestation of pregnancy: Secondary | ICD-10-CM

## 2022-05-10 LAB — CERVICOVAGINAL ANCILLARY ONLY
Chlamydia: NEGATIVE
Comment: NEGATIVE
Comment: NORMAL
Neisseria Gonorrhea: NEGATIVE

## 2022-05-10 NOTE — Telephone Encounter (Signed)
-----   Message from Catalina Antigua, MD sent at 05/09/2022 12:07 PM EDT ----- Patient wants this done as soon as possible. She is willing to have it done with a different MD because she does not want to wait until 5/25. Dr. Okey Dupre said she can do a D&C while on call, can be scheduled with her. Alternatively, we may need to ask Dr. Logan Bores and Dr. Valentino Saxon.  Let me know what happens  Surgery Booking Request Patient Full Name:  Kim Mayo  MRN: [id number]  DOB: [birth year]  Surgeon: Catalina Antigua, MD  Requested Surgery Date and Time: next available with any on call physician (patient does not want to wait until 5/25 which is my next surgery day) Primary Diagnosis AND Code: Missed abortion at [redacted]w[redacted]d Secondary Diagnosis and Code:  Surgical Procedure: Suction D&C RNFA Requested?: No L&D Notification: No Admission Status: same day surgery Length of Surgery: 25 min Special Case Needs: No Medical Clearance:  No Special Scheduling Instructions: No Any known health/anesthesia issues, diabetes, sleep apnea, latex allergy, defibrillator/pacemaker?: No Acuity: P4   (P1 highest, P2 delay may cause harm, P3 low, elective gyn, P4 lowest) Post op follow up visits: 2 weeks post procedure

## 2022-05-10 NOTE — H&P (Signed)
PRE-OPERATIVE HISTORY AND PHYSICAL EXAM  PCP:  Dortha Kern, MD Subjective:   HPI:  Kim Mayo is a 46 y.o. [phone].  Patient's last menstrual period was 02/22/2022 (approximate).  She presents today for a pre-op discussion and PE.  She has the following symptoms:  Non-viable 9.5 week IUP. She desires D&E for management.  Review of Systems:   Constitutional: Denied constitutional symptoms, night sweats, recent illness, fatigue, fever, insomnia and weight loss.  Eyes: Denied eye symptoms, eye pain, photophobia, vision change and visual disturbance.  Ears/Nose/Throat/Neck: Denied ear, nose, throat or neck symptoms, hearing loss, nasal discharge, sinus congestion and sore throat.  Cardiovascular: Denied cardiovascular symptoms, arrhythmia, chest pain/pressure, edema, exercise intolerance, orthopnea and palpitations.  Respiratory: Denied pulmonary symptoms, asthma, pleuritic pain, productive sputum, cough, dyspnea and wheezing.  Gastrointestinal: Denied, gastro-esophageal reflux, melena, nausea and vomiting.  Genitourinary: Denied genitourinary symptoms including symptomatic vaginal discharge, pelvic relaxation issues, and urinary complaints.  Musculoskeletal: Denied musculoskeletal symptoms, stiffness, swelling, muscle weakness and myalgia.  Dermatologic: Denied dermatology symptoms, rash and scar.  Neurologic: Denied neurology symptoms, dizziness, headache, neck pain and syncope.  Psychiatric: Denied psychiatric symptoms, anxiety and depression.  Endocrine: Denied endocrine symptoms including hot flashes and night sweats.   OB History  Gravida Para Term Preterm AB Living  4 3 2 1   3   SAB IAB Ectopic Multiple Live Births          3    # Outcome Date GA Lbr Len/2nd Weight Sex Delivery Anes PTL Lv  4 Current           3 Term 01/08/12   8 lb 12 oz (3.969 kg) M CS-Unspec  N LIV  2 Preterm 08/01/04 [redacted]w[redacted]d  6 lb (2.722 kg) M CS-Unspec  Y LIV  1 Term 04/09/02   7 lb (3.175 kg)  F Vag-Spont EPI N LIV    Past Medical History:  Diagnosis Date   Anxiety    Bronchitis    Depression    History of preterm labor     Past Surgical History:  Procedure Laterality Date   APPENDECTOMY     BREAST SURGERY     CESAREAN SECTION  08/01/2004   second c/s 01/08/12   CHOLECYSTECTOMY N/A 08/07/2015   Procedure: LAPAROSCOPIC CHOLECYSTECTOMY WITH INTRAOPERATIVE CHOLANGIOGRAM;  Surgeon: 08/09/2015 III, MD;  Location: [facility];  Service: General;  Laterality: N/A;   ESOPHAGOGASTRODUODENOSCOPY (EGD) WITH PROPOFOL N/A 09/15/2021   Procedure: ESOPHAGOGASTRODUODENOSCOPY (EGD) WITH PROPOFOL;  Surgeon: 09/17/2021, MD;  Location: [facility];  Service: Endoscopy;  Laterality: N/A;      SOCIAL HISTORY:  Social History   Tobacco Use  Smoking Status Never  Smokeless Tobacco Never   Social History   Substance and Sexual Activity  Alcohol Use Not Currently    Social History   Substance and Sexual Activity  Drug Use No    Family History  Problem Relation Age of Onset   Cancer Mother 80       brain   Heart disease Father    Hypertension Father    Thyroid disease Father    Leukemia Cousin    Leukemia Cousin     ALLERGIES:  Hydrocodone-acetaminophen, Oxycodone-acetaminophen, Cephalexin, Oxycodone-acetaminophen, and Codeine  MEDS:   Current Outpatient Medications on File Prior to Visit  Medication Sig Dispense Refill   metoCLOPramide (REGLAN) 10 MG tablet Take 1 tablet (10 mg total) by mouth 3 (three) times daily before meals. 90 tablet 2  ondansetron (ZOFRAN ODT) 8 MG disintegrating tablet Take 1 tablet (8 mg total) by mouth every 8 (eight) hours as needed for nausea or vomiting. 20 tablet 0   ondansetron (ZOFRAN) 4 MG tablet Take 4 mg by mouth.     pantoprazole (PROTONIX) 20 MG tablet Take 20 mg by mouth 2 (two) times daily.     promethazine (PHENERGAN) 25 MG tablet Take 1 tablet (25 mg total) by mouth every 6 (six) hours as needed for nausea or vomiting. 30  tablet 1   ALPRAZolam (XANAX) 1 MG tablet Take 1 mg by mouth 3 (three) times daily as needed. (Patient not taking: Reported on 05/10/2022)     dicyclomine (BENTYL) 10 MG capsule Take 10-20 mg by mouth 4 (four) times daily as needed (abdominal pain). (Patient not taking: Reported on 05/09/2022)     FLUoxetine (PROZAC) 10 MG capsule Take 1 capsule (10 mg total) by mouth daily. (Patient not taking: Reported on 05/09/2022) 30 capsule 11   promethazine-dextromethorphan (PROMETHAZINE-DM) 6.25-15 MG/5ML syrup Take 5 mLs by mouth 4 (four) times daily as needed for cough. (Patient not taking: Reported on 05/09/2022) 118 mL 0   sucralfate (CARAFATE) 1 g tablet Take 1 g by mouth 4 (four) times daily as needed. (Patient not taking: Reported on 05/09/2022)     No current facility-administered medications on file prior to visit.    No orders of the defined types were placed in this encounter.    Physical examination BP 110/70   Pulse (!) 56   Ht 5\' 5"  (1.651 m)   Wt 186 lb (84.4 kg)   LMP 02/22/2022 (Approximate)   BMI 30.95 kg/m   General NAD, Conversant  HEENT Atraumatic; Op clear with mmm.  Normo-cephalic. Pupils reactive. Anicteric sclerae  Thyroid/Neck Smooth without nodularity or enlargement. Normal ROM.  Neck Supple.  Skin No rashes, lesions or ulceration. Normal palpated skin turgor. No nodularity.  Breasts: No masses or discharge.  Symmetric.  No axillary adenopathy.  Lungs: Clear to auscultation.No rales or wheezes. Normal Respiratory effort, no retractions.  Heart: NSR.  No murmurs or rubs appreciated. No periferal edema  Abdomen: Soft.  Non-tender.  No masses.  No HSM. No hernia  Extremities: Moves all appropriately.  Normal ROM for age. No lymphadenopathy.  Neuro: Oriented to PPT.  Normal mood. Normal affect.     Pelvic:   Vulva: Normal appearance.  No lesions.  Vagina: No lesions or abnormalities noted.  Support: Normal pelvic support.  Urethra No masses tenderness or scarring.   Meatus Normal size without lesions or prolapse.  Cervix: Normal ectropion.  No lesions.  Anus: Normal exam.  No lesions.  Perineum: Normal exam.  No lesions.        Bimanual   Uterus: Normal size.  Non-tender.  Mobile.  AV.  Adnexae: No masses.  Non-tender to palpation.  Cul-de-sac: Negative for abnormality.   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 Preop examination   2. Missed ab      Plan:   Orders: No orders of the defined types were placed in this encounter.  1.  D&E  

## 2022-05-10 NOTE — H&P (View-Only) (Signed)
PRE-OPERATIVE HISTORY AND PHYSICAL EXAM  PCP:  Dortha Kern, MD Subjective:   HPI:  Kim Mayo is a 46 y.o. [phone].  Patient's last menstrual period was 02/22/2022 (approximate).  She presents today for a pre-op discussion and PE.  She has the following symptoms:  Non-viable 9.5 week IUP. She desires D&E for management.  Review of Systems:   Constitutional: Denied constitutional symptoms, night sweats, recent illness, fatigue, fever, insomnia and weight loss.  Eyes: Denied eye symptoms, eye pain, photophobia, vision change and visual disturbance.  Ears/Nose/Throat/Neck: Denied ear, nose, throat or neck symptoms, hearing loss, nasal discharge, sinus congestion and sore throat.  Cardiovascular: Denied cardiovascular symptoms, arrhythmia, chest pain/pressure, edema, exercise intolerance, orthopnea and palpitations.  Respiratory: Denied pulmonary symptoms, asthma, pleuritic pain, productive sputum, cough, dyspnea and wheezing.  Gastrointestinal: Denied, gastro-esophageal reflux, melena, nausea and vomiting.  Genitourinary: Denied genitourinary symptoms including symptomatic vaginal discharge, pelvic relaxation issues, and urinary complaints.  Musculoskeletal: Denied musculoskeletal symptoms, stiffness, swelling, muscle weakness and myalgia.  Dermatologic: Denied dermatology symptoms, rash and scar.  Neurologic: Denied neurology symptoms, dizziness, headache, neck pain and syncope.  Psychiatric: Denied psychiatric symptoms, anxiety and depression.  Endocrine: Denied endocrine symptoms including hot flashes and night sweats.   OB History  Gravida Para Term Preterm AB Living  4 3 2 1   3   SAB IAB Ectopic Multiple Live Births          3    # Outcome Date GA Lbr Len/2nd Weight Sex Delivery Anes PTL Lv  4 Current           3 Term 01/08/12   8 lb 12 oz (3.969 kg) M CS-Unspec  N LIV  2 Preterm 08/01/04 [redacted]w[redacted]d  6 lb (2.722 kg) M CS-Unspec  Y LIV  1 Term 04/09/02   7 lb (3.175 kg)  F Vag-Spont EPI N LIV    Past Medical History:  Diagnosis Date   Anxiety    Bronchitis    Depression    History of preterm labor     Past Surgical History:  Procedure Laterality Date   APPENDECTOMY     BREAST SURGERY     CESAREAN SECTION  08/01/2004   second c/s 01/08/12   CHOLECYSTECTOMY N/A 08/07/2015   Procedure: LAPAROSCOPIC CHOLECYSTECTOMY WITH INTRAOPERATIVE CHOLANGIOGRAM;  Surgeon: 08/09/2015 III, MD;  Location: [facility];  Service: General;  Laterality: N/A;   ESOPHAGOGASTRODUODENOSCOPY (EGD) WITH PROPOFOL N/A 09/15/2021   Procedure: ESOPHAGOGASTRODUODENOSCOPY (EGD) WITH PROPOFOL;  Surgeon: 09/17/2021, MD;  Location: [facility];  Service: Endoscopy;  Laterality: N/A;      SOCIAL HISTORY:  Social History   Tobacco Use  Smoking Status Never  Smokeless Tobacco Never   Social History   Substance and Sexual Activity  Alcohol Use Not Currently    Social History   Substance and Sexual Activity  Drug Use No    Family History  Problem Relation Age of Onset   Cancer Mother 64       brain   Heart disease Father    Hypertension Father    Thyroid disease Father    Leukemia Cousin    Leukemia Cousin     ALLERGIES:  Hydrocodone-acetaminophen, Oxycodone-acetaminophen, Cephalexin, Oxycodone-acetaminophen, and Codeine  MEDS:   Current Outpatient Medications on File Prior to Visit  Medication Sig Dispense Refill   metoCLOPramide (REGLAN) 10 MG tablet Take 1 tablet (10 mg total) by mouth 3 (three) times daily before meals. 90 tablet 2  ondansetron (ZOFRAN ODT) 8 MG disintegrating tablet Take 1 tablet (8 mg total) by mouth every 8 (eight) hours as needed for nausea or vomiting. 20 tablet 0   ondansetron (ZOFRAN) 4 MG tablet Take 4 mg by mouth.     pantoprazole (PROTONIX) 20 MG tablet Take 20 mg by mouth 2 (two) times daily.     promethazine (PHENERGAN) 25 MG tablet Take 1 tablet (25 mg total) by mouth every 6 (six) hours as needed for nausea or vomiting. 30  tablet 1   ALPRAZolam (XANAX) 1 MG tablet Take 1 mg by mouth 3 (three) times daily as needed. (Patient not taking: Reported on 05/10/2022)     dicyclomine (BENTYL) 10 MG capsule Take 10-20 mg by mouth 4 (four) times daily as needed (abdominal pain). (Patient not taking: Reported on 05/09/2022)     FLUoxetine (PROZAC) 10 MG capsule Take 1 capsule (10 mg total) by mouth daily. (Patient not taking: Reported on 05/09/2022) 30 capsule 11   promethazine-dextromethorphan (PROMETHAZINE-DM) 6.25-15 MG/5ML syrup Take 5 mLs by mouth 4 (four) times daily as needed for cough. (Patient not taking: Reported on 05/09/2022) 118 mL 0   sucralfate (CARAFATE) 1 g tablet Take 1 g by mouth 4 (four) times daily as needed. (Patient not taking: Reported on 05/09/2022)     No current facility-administered medications on file prior to visit.    No orders of the defined types were placed in this encounter.    Physical examination BP 110/70   Pulse (!) 56   Ht 5\' 5"  (1.651 m)   Wt 186 lb (84.4 kg)   LMP 02/22/2022 (Approximate)   BMI 30.95 kg/m   General NAD, Conversant  HEENT Atraumatic; Op clear with mmm.  Normo-cephalic. Pupils reactive. Anicteric sclerae  Thyroid/Neck Smooth without nodularity or enlargement. Normal ROM.  Neck Supple.  Skin No rashes, lesions or ulceration. Normal palpated skin turgor. No nodularity.  Breasts: No masses or discharge.  Symmetric.  No axillary adenopathy.  Lungs: Clear to auscultation.No rales or wheezes. Normal Respiratory effort, no retractions.  Heart: NSR.  No murmurs or rubs appreciated. No periferal edema  Abdomen: Soft.  Non-tender.  No masses.  No HSM. No hernia  Extremities: Moves all appropriately.  Normal ROM for age. No lymphadenopathy.  Neuro: Oriented to PPT.  Normal mood. Normal affect.     Pelvic:   Vulva: Normal appearance.  No lesions.  Vagina: No lesions or abnormalities noted.  Support: Normal pelvic support.  Urethra No masses tenderness or scarring.   Meatus Normal size without lesions or prolapse.  Cervix: Normal ectropion.  No lesions.  Anus: Normal exam.  No lesions.  Perineum: Normal exam.  No lesions.        Bimanual   Uterus: Normal size.  Non-tender.  Mobile.  AV.  Adnexae: No masses.  Non-tender to palpation.  Cul-de-sac: Negative for abnormality.   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 Preop examination   2. Missed ab      Plan:   Orders: No orders of the defined types were placed in this encounter.  1.  D&E

## 2022-05-10 NOTE — Progress Notes (Signed)
PRE-OPERATIVE HISTORY AND PHYSICAL EXAM  PCP:  Dortha Kern, MD Subjective:   HPI:  Kim Mayo is a 46 y.o. [phone].  Patient's last menstrual period was 02/22/2022 (approximate).  She presents today for a pre-op discussion and PE.  She has the following symptoms:  Non-viable 9.5 week IUP. She desires D&E for management.  Review of Systems:   Constitutional: Denied constitutional symptoms, night sweats, recent illness, fatigue, fever, insomnia and weight loss.  Eyes: Denied eye symptoms, eye pain, photophobia, vision change and visual disturbance.  Ears/Nose/Throat/Neck: Denied ear, nose, throat or neck symptoms, hearing loss, nasal discharge, sinus congestion and sore throat.  Cardiovascular: Denied cardiovascular symptoms, arrhythmia, chest pain/pressure, edema, exercise intolerance, orthopnea and palpitations.  Respiratory: Denied pulmonary symptoms, asthma, pleuritic pain, productive sputum, cough, dyspnea and wheezing.  Gastrointestinal: Denied, gastro-esophageal reflux, melena, nausea and vomiting.  Genitourinary: Denied genitourinary symptoms including symptomatic vaginal discharge, pelvic relaxation issues, and urinary complaints.  Musculoskeletal: Denied musculoskeletal symptoms, stiffness, swelling, muscle weakness and myalgia.  Dermatologic: Denied dermatology symptoms, rash and scar.  Neurologic: Denied neurology symptoms, dizziness, headache, neck pain and syncope.  Psychiatric: Denied psychiatric symptoms, anxiety and depression.  Endocrine: Denied endocrine symptoms including hot flashes and night sweats.   OB History  Gravida Para Term Preterm AB Living  4 3 2 1   3   SAB IAB Ectopic Multiple Live Births          3    # Outcome Date GA Lbr Len/2nd Weight Sex Delivery Anes PTL Lv  4 Current           3 Term 01/08/12   8 lb 12 oz (3.969 kg) M CS-Unspec  N LIV  2 Preterm 08/01/04 [redacted]w[redacted]d  6 lb (2.722 kg) M CS-Unspec  Y LIV  1 Term 04/09/02   7 lb (3.175 kg)  F Vag-Spont EPI N LIV    Past Medical History:  Diagnosis Date   Anxiety    Bronchitis    Depression    History of preterm labor     Past Surgical History:  Procedure Laterality Date   APPENDECTOMY     BREAST SURGERY     CESAREAN SECTION  08/01/2004   second c/s 01/08/12   CHOLECYSTECTOMY N/A 08/07/2015   Procedure: LAPAROSCOPIC CHOLECYSTECTOMY WITH INTRAOPERATIVE CHOLANGIOGRAM;  Surgeon: 08/09/2015 III, MD;  Location: [facility];  Service: General;  Laterality: N/A;   ESOPHAGOGASTRODUODENOSCOPY (EGD) WITH PROPOFOL N/A 09/15/2021   Procedure: ESOPHAGOGASTRODUODENOSCOPY (EGD) WITH PROPOFOL;  Surgeon: 09/17/2021, MD;  Location: [facility];  Service: Endoscopy;  Laterality: N/A;      SOCIAL HISTORY:  Social History   Tobacco Use  Smoking Status Never  Smokeless Tobacco Never   Social History   Substance and Sexual Activity  Alcohol Use Not Currently    Social History   Substance and Sexual Activity  Drug Use No    Family History  Problem Relation Age of Onset   Cancer Mother 80       brain   Heart disease Father    Hypertension Father    Thyroid disease Father    Leukemia Cousin    Leukemia Cousin     ALLERGIES:  Hydrocodone-acetaminophen, Oxycodone-acetaminophen, Cephalexin, Oxycodone-acetaminophen, and Codeine  MEDS:   Current Outpatient Medications on File Prior to Visit  Medication Sig Dispense Refill   metoCLOPramide (REGLAN) 10 MG tablet Take 1 tablet (10 mg total) by mouth 3 (three) times daily before meals. 90 tablet 2  ondansetron (ZOFRAN ODT) 8 MG disintegrating tablet Take 1 tablet (8 mg total) by mouth every 8 (eight) hours as needed for nausea or vomiting. 20 tablet 0   ondansetron (ZOFRAN) 4 MG tablet Take 4 mg by mouth.     pantoprazole (PROTONIX) 20 MG tablet Take 20 mg by mouth 2 (two) times daily.     promethazine (PHENERGAN) 25 MG tablet Take 1 tablet (25 mg total) by mouth every 6 (six) hours as needed for nausea or vomiting. 30  tablet 1   ALPRAZolam (XANAX) 1 MG tablet Take 1 mg by mouth 3 (three) times daily as needed. (Patient not taking: Reported on 05/10/2022)     dicyclomine (BENTYL) 10 MG capsule Take 10-20 mg by mouth 4 (four) times daily as needed (abdominal pain). (Patient not taking: Reported on 05/09/2022)     FLUoxetine (PROZAC) 10 MG capsule Take 1 capsule (10 mg total) by mouth daily. (Patient not taking: Reported on 05/09/2022) 30 capsule 11   promethazine-dextromethorphan (PROMETHAZINE-DM) 6.25-15 MG/5ML syrup Take 5 mLs by mouth 4 (four) times daily as needed for cough. (Patient not taking: Reported on 05/09/2022) 118 mL 0   sucralfate (CARAFATE) 1 g tablet Take 1 g by mouth 4 (four) times daily as needed. (Patient not taking: Reported on 05/09/2022)     No current facility-administered medications on file prior to visit.    No orders of the defined types were placed in this encounter.    Physical examination BP 110/70   Pulse (!) 56   Ht 5\' 5"  (1.651 m)   Wt 186 lb (84.4 kg)   LMP 02/22/2022 (Approximate)   BMI 30.95 kg/m   General NAD, Conversant  HEENT Atraumatic; Op clear with mmm.  Normo-cephalic. Pupils reactive. Anicteric sclerae  Thyroid/Neck Smooth without nodularity or enlargement. Normal ROM.  Neck Supple.  Skin No rashes, lesions or ulceration. Normal palpated skin turgor. No nodularity.  Breasts: No masses or discharge.  Symmetric.  No axillary adenopathy.  Lungs: Clear to auscultation.No rales or wheezes. Normal Respiratory effort, no retractions.  Heart: NSR.  No murmurs or rubs appreciated. No periferal edema  Abdomen: Soft.  Non-tender.  No masses.  No HSM. No hernia  Extremities: Moves all appropriately.  Normal ROM for age. No lymphadenopathy.  Neuro: Oriented to PPT.  Normal mood. Normal affect.     Pelvic:   Vulva: Normal appearance.  No lesions.  Vagina: No lesions or abnormalities noted.  Support: Normal pelvic support.  Urethra No masses tenderness or scarring.   Meatus Normal size without lesions or prolapse.  Cervix: Normal ectropion.  No lesions.  Anus: Normal exam.  No lesions.  Perineum: Normal exam.  No lesions.        Bimanual   Uterus: Normal size.  Non-tender.  Mobile.  AV.  Adnexae: No masses.  Non-tender to palpation.  Cul-de-sac: Negative for abnormality.   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 Preop examination   2. Missed ab      Plan:   Orders: No orders of the defined types were placed in this encounter.  1.  D&E  Pre-op discussions regarding Risks and Benefits of her scheduled surgery.  D&E The procedure and the risks and benefits of dilation and curettage/evacuation have been explained to the patient.  The specific risks of bleeding, infection, anesthesia, uterine perforation, and damage to bowel or bladder  have been specifically discussed.  I have answered all of her questions and I believe that she has an adequate and informed understanding of this procedure.   I spent 35 minutes involved in the care of this patient preparing to see the patient by obtaining and reviewing her medical history (including labs, imaging tests and prior procedures), documenting clinical information in the electronic health record (EHR), counseling and coordinating care plans, writing and sending prescriptions, ordering tests or procedures and in direct communicating with the patient and medical staff discussing pertinent items from her history and physical exam.  Elonda Husky, M.D. 05/10/2022 4:46 PM

## 2022-05-10 NOTE — Telephone Encounter (Signed)
Called patient to schedule suction d&c with [facility]  DOS 05/14/22 @ 1:00pm  H&P 05/10/22 @ 12:30 @ [facility]   Pre-admit phone call appt - 5/19 between 8am - 1pm  Advised that pt may also receive calls from the hospital pharmacy and pre-service center.  Confirmed pt has [employer] as Chartered certified accountant. No secondary insurance.

## 2022-05-11 ENCOUNTER — Encounter
Admission: RE | Admit: 2022-05-11 | Discharge: 2022-05-11 | Disposition: A | Discharge status: Home / Self Care | Payer: Medicaid Other | Source: Ambulatory Visit | Attending: Obstetrics and Gynecology | Admitting: Obstetrics and Gynecology

## 2022-05-11 HISTORY — DX: Complete or unspecified spontaneous abortion without complication: O03.9

## 2022-05-11 HISTORY — DX: Other specified postprocedural states: Z98.890

## 2022-05-11 HISTORY — DX: Gastro-esophageal reflux disease without esophagitis: K21.9

## 2022-05-11 NOTE — Patient Instructions (Addendum)
Your procedure is scheduled on:05-14-22 Monday Report to the Registration Desk on the 1st floor of the [facility].Then proceed to the 2nd floor Surgery Desk To find out your arrival time, please call [phone] between 1PM - 3PM on:05-11-22 Friday If your arrival time is 6:00 am, do not arrive prior to that time as the [facility] entrance doors do not open until 6:00 am.  REMEMBER: Instructions that are not followed completely may result in serious medical risk, up to and including death; or upon the discretion of your surgeon and anesthesiologist your surgery may need to be rescheduled.  Do not eat food after midnight the night before surgery.  No gum chewing, lozengers or hard candies.  You may however, drink CLEAR liquids up to 2 hours before you are scheduled to arrive for your surgery. Do not drink anything within 2 hours of your scheduled arrival time.  Clear liquids include: - water  - apple juice without pulp - gatorade (not RED colors) - black coffee or tea (Do NOT add milk or creamers to the coffee or tea) Do NOT drink anything that is not on this list  TAKE THESE MEDICATIONS THE MORNING OF SURGERY WITH A SIP OF WATER: -You may take ALPRAZolam Prudy Feeler) the day of surgery if needed for anxiety -You may take promethazine (PHENERGAN) OR ondansetron (ZOFRAN) for nausea if needed  One week prior to surgery: Stop Anti-inflammatories (NSAIDS) such as Advil, Aleve, Ibuprofen, Motrin, Naproxen, Naprosyn and Aspirin based products such as Excedrin, Goodys Powder, BC Powder. You may however, take Tylenol if needed for pain up until the day of surgery.  No Alcohol for 24 hours before or after surgery.  No Smoking including e-cigarettes for 24 hours prior to surgery.  No chewable tobacco products for at least 6 hours prior to surgery.  No nicotine patches on the day of surgery.  Do not use any "recreational" drugs for at least a week prior to your surgery.  Please be advised that  the combination of cocaine and anesthesia may have negative outcomes, up to and including death. If you test positive for cocaine, your surgery will be cancelled.  On the morning of surgery brush your teeth with toothpaste and water, you may rinse your mouth with mouthwash if you wish. Do not swallow any toothpaste or mouthwash.  Do not wear jewelry, make-up, hairpins, clips or nail polish.  Do not wear lotions, powders, or perfumes.   Do not shave body from the neck down 48 hours prior to surgery just in case you cut yourself which could leave a site for infection.  Also, freshly shaved skin may become irritated if using the CHG soap.  Contact lenses, hearing aids and dentures may not be worn into surgery.  Do not bring valuables to the hospital. [facility] is not responsible for any missing/lost belongings or valuables.   Notify your doctor if there is any change in your medical condition (cold, fever, infection).  Wear comfortable clothing (specific to your surgery type) to the hospital.  After surgery, you can help prevent lung complications by doing breathing exercises.  Take deep breaths and cough every 1-2 hours. Your doctor may order a device called an Incentive Spirometer to help you take deep breaths. When coughing or sneezing, hold a pillow firmly against your incision with both hands. This is called "splinting." Doing this helps protect your incision. It also decreases belly discomfort.  If you are being admitted to the hospital overnight, leave your suitcase  in the car. After surgery it may be brought to your room.  If you are being discharged the day of surgery, you will not be allowed to drive home. You will need a responsible adult (18 years or older) to drive you home and stay with you that night.   If you are taking public transportation, you will need to have a responsible adult (18 years or older) with you. Please confirm with your physician that it is acceptable  to use public transportation.   Please call the Pre-admissions Testing Dept. at [phone] if you have any questions about these instructions.  Surgery Visitation Policy:  Patients undergoing a surgery or procedure may have two family members or support persons with them as long as the person is not COVID-19 positive or experiencing its symptoms.

## 2022-05-14 ENCOUNTER — Ambulatory Visit: Payer: Medicaid Other | Admitting: Anesthesiology

## 2022-05-14 ENCOUNTER — Encounter
Admission: RE | Disposition: A | Discharge status: Home / Self Care | Payer: Self-pay | Source: Home / Self Care | Attending: Obstetrics and Gynecology

## 2022-05-14 ENCOUNTER — Ambulatory Visit
Admission: RE | Admit: 2022-05-14 | Discharge: 2022-05-14 | Disposition: A | Discharge status: Home / Self Care | Payer: Medicaid Other | Attending: Obstetrics and Gynecology | Admitting: Obstetrics and Gynecology

## 2022-05-14 ENCOUNTER — Other Ambulatory Visit: Payer: Self-pay

## 2022-05-14 DIAGNOSIS — F419 Anxiety disorder, unspecified: Secondary | ICD-10-CM | POA: Insufficient documentation

## 2022-05-14 DIAGNOSIS — Z01818 Encounter for other preprocedural examination: Secondary | ICD-10-CM

## 2022-05-14 DIAGNOSIS — K219 Gastro-esophageal reflux disease without esophagitis: Secondary | ICD-10-CM | POA: Diagnosis not present

## 2022-05-14 DIAGNOSIS — F32A Depression, unspecified: Secondary | ICD-10-CM | POA: Diagnosis not present

## 2022-05-14 DIAGNOSIS — O021 Missed abortion: Secondary | ICD-10-CM | POA: Diagnosis present

## 2022-05-14 DIAGNOSIS — Z8616 Personal history of COVID-19: Secondary | ICD-10-CM | POA: Insufficient documentation

## 2022-05-14 HISTORY — PX: DILATION AND EVACUATION: SHX1459

## 2022-05-14 LAB — ANTIBODY SCREEN: Antibody Screen: NEGATIVE

## 2022-05-14 SURGERY — DILATION AND EVACUATION, UTERUS
Anesthesia: General

## 2022-05-14 MED ORDER — CHLORHEXIDINE GLUCONATE 0.12 % MT SOLN
OROMUCOSAL | Status: AC
Start: 2022-05-14 — End: 2022-05-14
  Administered 2022-05-14: 15 mL via OROMUCOSAL
  Filled 2022-05-14: qty 15

## 2022-05-14 MED ORDER — GABAPENTIN 300 MG PO CAPS
ORAL_CAPSULE | ORAL | Status: AC
Start: 2022-05-14 — End: 2022-05-14
  Administered 2022-05-14: 300 mg via ORAL
  Filled 2022-05-14: qty 1

## 2022-05-14 MED ORDER — PROPOFOL 1000 MG/100ML IV EMUL
INTRAVENOUS | Status: AC
  Filled 2022-05-14: qty 100

## 2022-05-14 MED ORDER — DEXAMETHASONE SODIUM PHOSPHATE 10 MG/ML IJ SOLN
INTRAMUSCULAR | Status: AC
  Filled 2022-05-14: qty 1

## 2022-05-14 MED ORDER — FAMOTIDINE 20 MG PO TABS: 20.0000 mg | ORAL_TABLET | Freq: Once | ORAL | Status: AC

## 2022-05-14 MED ORDER — ONDANSETRON HCL 4 MG/2ML IJ SOLN: 4.0000 mg | Freq: Once | INTRAMUSCULAR | Status: DC | PRN

## 2022-05-14 MED ORDER — LACTATED RINGERS IV SOLN: INTRAVENOUS | Status: DC

## 2022-05-14 MED ORDER — CHLORHEXIDINE GLUCONATE 0.12 % MT SOLN: 15.0000 mL | Freq: Once | OROMUCOSAL | Status: AC

## 2022-05-14 MED ORDER — GABAPENTIN 300 MG PO CAPS: 300.0000 mg | ORAL_CAPSULE | ORAL | Status: AC

## 2022-05-14 MED ORDER — FENTANYL CITRATE (PF) 100 MCG/2ML IJ SOLN
INTRAMUSCULAR | Status: DC | PRN
  Administered 2022-05-14: 25 ug via INTRAVENOUS

## 2022-05-14 MED ORDER — LIDOCAINE HCL (PF) 2 % IJ SOLN
INTRAMUSCULAR | Status: AC
  Filled 2022-05-14: qty 5

## 2022-05-14 MED ORDER — ONDANSETRON HCL 4 MG/2ML IJ SOLN
INTRAMUSCULAR | Status: AC
  Filled 2022-05-14: qty 2

## 2022-05-14 MED ORDER — PROPOFOL 10 MG/ML IV BOLUS
INTRAVENOUS | Status: DC | PRN
Start: 2022-05-14 — End: 2022-05-14
  Administered 2022-05-14: 200 mg via INTRAVENOUS

## 2022-05-14 MED ORDER — ONDANSETRON HCL 4 MG/2ML IJ SOLN
INTRAMUSCULAR | Status: DC | PRN
  Administered 2022-05-14: 4 mg via INTRAVENOUS

## 2022-05-14 MED ORDER — MIDAZOLAM HCL 2 MG/2ML IJ SOLN
INTRAMUSCULAR | Status: AC
  Filled 2022-05-14: qty 2

## 2022-05-14 MED ORDER — PROPOFOL 10 MG/ML IV BOLUS
INTRAVENOUS | Status: AC
  Filled 2022-05-14: qty 20

## 2022-05-14 MED ORDER — FENTANYL CITRATE (PF) 100 MCG/2ML IJ SOLN: 25.0000 ug | INTRAMUSCULAR | Status: DC | PRN

## 2022-05-14 MED ORDER — CELECOXIB 200 MG PO CAPS: 400.0000 mg | ORAL_CAPSULE | ORAL | Status: AC

## 2022-05-14 MED ORDER — FAMOTIDINE 20 MG PO TABS
ORAL_TABLET | ORAL | Status: AC
  Administered 2022-05-14: 20 mg via ORAL
  Filled 2022-05-14: qty 1

## 2022-05-14 MED ORDER — LACTATED RINGERS IV SOLN: INTRAVENOUS | Status: DC | PRN

## 2022-05-14 MED ORDER — DEXAMETHASONE SODIUM PHOSPHATE 10 MG/ML IJ SOLN
INTRAMUSCULAR | Status: DC | PRN
  Administered 2022-05-14: 10 mg via INTRAVENOUS

## 2022-05-14 MED ORDER — IBUPROFEN 800 MG PO TABS
ORAL_TABLET | ORAL | Status: AC
  Filled 2022-05-14: qty 1

## 2022-05-14 MED ORDER — OXYTOCIN 10 UNIT/ML IJ SOLN
INTRAMUSCULAR | Status: DC | PRN
  Administered 2022-05-14: 20 [IU] via INTRAMUSCULAR

## 2022-05-14 MED ORDER — CELECOXIB 200 MG PO CAPS
ORAL_CAPSULE | ORAL | Status: AC
  Administered 2022-05-14: 400 mg via ORAL
  Filled 2022-05-14: qty 2

## 2022-05-14 MED ORDER — POVIDONE-IODINE 10 % EX SWAB: 2.0000 "application " | Freq: Once | CUTANEOUS | Status: DC

## 2022-05-14 MED ORDER — MIDAZOLAM HCL 2 MG/2ML IJ SOLN
INTRAMUSCULAR | Status: DC | PRN
  Administered 2022-05-14: 2 mg via INTRAVENOUS

## 2022-05-14 MED ORDER — STERILE WATER FOR IRRIGATION IR SOLN
Status: DC | PRN
  Administered 2022-05-14: 300 mL

## 2022-05-14 MED ORDER — IBUPROFEN 800 MG PO TABS: 800.0000 mg | ORAL_TABLET | Freq: Three times a day (TID) | ORAL | 0 refills | Status: DC | PRN

## 2022-05-14 MED ORDER — PROPOFOL 500 MG/50ML IV EMUL
INTRAVENOUS | Status: DC | PRN
  Administered 2022-05-14: 150 ug/kg/min via INTRAVENOUS

## 2022-05-14 MED ORDER — SILVER NITRATE-POT NITRATE 75-25 % EX MISC
CUTANEOUS | Status: AC
  Filled 2022-05-14: qty 10

## 2022-05-14 MED ORDER — IBUPROFEN 800 MG PO TABS
800.0000 mg | ORAL_TABLET | Freq: Once | ORAL | Status: AC
  Administered 2022-05-14: 800 mg via ORAL

## 2022-05-14 MED ORDER — OXYTOCIN 10 UNIT/ML IJ SOLN
INTRAMUSCULAR | Status: AC
  Filled 2022-05-14: qty 2

## 2022-05-14 MED ORDER — ACETAMINOPHEN 10 MG/ML IV SOLN: 1000.0000 mg | Freq: Once | INTRAVENOUS | Status: DC | PRN

## 2022-05-14 MED ORDER — LIDOCAINE HCL (CARDIAC) PF 100 MG/5ML IV SOSY
PREFILLED_SYRINGE | INTRAVENOUS | Status: DC | PRN
  Administered 2022-05-14: 100 mg via INTRAVENOUS

## 2022-05-14 MED ORDER — ORAL CARE MOUTH RINSE: 15.0000 mL | Freq: Once | OROMUCOSAL | Status: AC

## 2022-05-14 MED ORDER — FENTANYL CITRATE (PF) 100 MCG/2ML IJ SOLN
INTRAMUSCULAR | Status: AC
  Filled 2022-05-14: qty 2

## 2022-05-14 SURGICAL SUPPLY — 24 items
BACTOSHIELD CHG 4% 4OZ (MISCELLANEOUS) ×1
DRSG TELFA 3X8 NADH (GAUZE/BANDAGES/DRESSINGS) ×2 IMPLANT
FILTER UTR ASPR SPEC (MISCELLANEOUS) ×1 IMPLANT
FLTR UTR ASPR SPEC (MISCELLANEOUS) ×2
GLOVE BIOGEL PI ORTHO PRO 7.5 (GLOVE) ×1
GLOVE PI ORTHO PRO STRL 7.5 (GLOVE) ×1 IMPLANT
GOWN STRL REUS W/ TWL LRG LVL3 (GOWN DISPOSABLE) ×2 IMPLANT
GOWN STRL REUS W/TWL LRG LVL3 (GOWN DISPOSABLE) ×4
GRADUATE 1200CC STRL 31836 (MISCELLANEOUS) ×2 IMPLANT
KIT BERKELEY 1ST TRIMESTER 3/8 (MISCELLANEOUS) ×2 IMPLANT
KIT TURNOVER KIT A (KITS) ×2 IMPLANT
MANIFOLD NEPTUNE II (INSTRUMENTS) ×2 IMPLANT
NDL HYPO 25X1 1.5 SAFETY (NEEDLE) ×1 IMPLANT
NEEDLE HYPO 25X1 1.5 SAFETY (NEEDLE) ×2 IMPLANT
PACK DNC HYST (MISCELLANEOUS) ×2 IMPLANT
PAD DRESSING TELFA 3X8 NADH (GAUZE/BANDAGES/DRESSINGS) ×1 IMPLANT
PAD OB MATERNITY 4.3X12.25 (PERSONAL CARE ITEMS) ×2 IMPLANT
PAD PREP 24X41 OB/GYN DISP (PERSONAL CARE ITEMS) ×2 IMPLANT
SCRUB CHG 4% DYNA-HEX 4OZ (MISCELLANEOUS) ×1 IMPLANT
SET BERKELEY SUCTION TUBING (SUCTIONS) ×2 IMPLANT
SOL PREP PVP 2OZ (MISCELLANEOUS) ×2
SOLUTION PREP PVP 2OZ (MISCELLANEOUS) ×1 IMPLANT
VACURETTE 10 RIGID CVD (CANNULA) ×1 IMPLANT
WATER STERILE IRR 500ML POUR (IV SOLUTION) ×2 IMPLANT

## 2022-05-14 NOTE — Discharge Instructions (Signed)
AMBULATORY SURGERY  ?DISCHARGE INSTRUCTIONS ? ? ?The drugs that you were given will stay in your system until tomorrow so for the next 24 hours you should not: ? ?Drive an automobile ?Make any legal decisions ?Drink any alcoholic beverage ? ? ?You may resume regular meals tomorrow.  Today it is better to start with liquids and gradually work up to solid foods. ? ?You may eat anything you prefer, but it is better to start with liquids, then soup and crackers, and gradually work up to solid foods. ? ? ?Please notify your doctor immediately if you have any unusual bleeding, trouble breathing, redness and pain at the surgery site, drainage, fever, or pain not relieved by medication. ? ? ? ?Additional Instructions: ? ? ? ?Please contact your physician with any problems or Same Day Surgery at [phone], Monday through Friday 6 am to 4 pm, or [place] [facility] [place] [facility] number at [phone].  ?

## 2022-05-14 NOTE — Anesthesia Preprocedure Evaluation (Addendum)
Anesthesia Evaluation  Patient identified by MRN, date of birth, ID band Patient awake    Reviewed: Allergy & Precautions, H&P , NPO status , Patient's Chart, lab work & pertinent test results, reviewed documented beta blocker date and time   History of Anesthesia Complications (+) PONV and history of anesthetic complications  Airway Mallampati: I  TM Distance: >3 FB Neck ROM: full    Dental no notable dental hx. (+) Teeth Intact   Pulmonary neg pulmonary ROS,    Pulmonary exam normal breath sounds clear to auscultation       Cardiovascular Exercise Tolerance: Good negative cardio ROS Normal cardiovascular exam Rhythm:regular Rate:Normal     Neuro/Psych  Headaches, PSYCHIATRIC DISORDERS (Anxiety) Anxiety Depression    GI/Hepatic Neg liver ROS, GERD  Controlled,  Endo/Other  negative endocrine ROS  Renal/GU negative Renal ROS  negative genitourinary   Musculoskeletal   Abdominal Normal abdominal exam  (+)   Peds  Hematology negative hematology ROS (+)   Anesthesia Other Findings Patient had COVID in January and since then she lost her appetite with a lot of nausea and lost weight due to intolerance of food.  Electrolytes reviewed Past Medical History:   Anxiety                                                      Reproductive/Obstetrics (+) Pregnancy Non-viable 9.5 week IUP                            Anesthesia Physical  Anesthesia Plan  ASA: 2  Anesthesia Plan: General   Post-op Pain Management: Celebrex PO (pre-op)*, Gabapentin PO (pre-op)* and Ofirmev IV (intra-op)*   Induction: Intravenous  PONV Risk Score and Plan: 4 or greater and TIVA, Propofol infusion, Ondansetron, Dexamethasone and Midazolam  Airway Management Planned: LMA  Additional Equipment:   Intra-op Plan:   Post-operative Plan: Extubation in OR  Informed Consent: I have reviewed the patients History and  Physical, chart, labs and discussed the procedure including the risks, benefits and alternatives for the proposed anesthesia with the patient or authorized representative who has indicated his/her understanding and acceptance.     Dental advisory given  Plan Discussed with: Anesthesiologist, CRNA and Surgeon  Anesthesia Plan Comments:        Anesthesia Quick Evaluation

## 2022-05-14 NOTE — Progress Notes (Signed)
Called into room. Patient went back to surgery without type and screen collected. Per Dr Logan Bores not needed.

## 2022-05-14 NOTE — Transfer of Care (Signed)
Immediate Anesthesia Transfer of Care Note  Patient: Kim Mayo  Procedure(s) Performed: DILATATION AND EVACUATION  Patient Location: PACU  Anesthesia Type:General  Level of Consciousness: drowsy and patient cooperative  Airway & Oxygen Therapy: Patient Spontanous Breathing and Patient connected to face mask oxygen  Post-op Assessment: Report given to RN and Post -op Vital signs reviewed and stable  Post vital signs: Reviewed and stable  Last Vitals:  Vitals Value Taken Time  BP 107/67 05/14/22 1034  Temp 36.6 C 05/14/22 1034  Pulse 71 05/14/22 1041  Resp 17 05/14/22 1041  SpO2 99 % 05/14/22 1041  Vitals shown include unvalidated device data.  Last Pain:  Vitals:   05/14/22 1034  TempSrc:   PainSc: Asleep         Complications: No notable events documented.

## 2022-05-14 NOTE — Anesthesia Postprocedure Evaluation (Signed)
Anesthesia Post Note  Patient: Kim Mayo  Procedure(s) Performed: DILATATION AND EVACUATION  Patient location during evaluation: PACU Anesthesia Type: General Level of consciousness: awake and alert Pain management: pain level controlled Vital Signs Assessment: post-procedure vital signs reviewed and stable Respiratory status: spontaneous breathing, nonlabored ventilation and respiratory function stable Cardiovascular status: blood pressure returned to baseline and stable Postop Assessment: no apparent nausea or vomiting Anesthetic complications: no   No notable events documented.   Last Vitals:  Vitals:   05/14/22 1217 05/14/22 1303  BP: 130/83 107/72  Pulse: 67 67  Resp: 16 14  Temp: (!) 36.2 C   SpO2: 100%     Last Pain:  Vitals:   05/14/22 1303  TempSrc:   PainSc: 3                  Foye Deer

## 2022-05-14 NOTE — Op Note (Signed)
    OPERATIVE NOTE 05/14/2022 10:30 AM  PRE-OPERATIVE DIAGNOSIS:  1) missed abortion  POST-OPERATIVE DIAGNOSIS:  1) Same  OPERATION:  D&E  SURGEON(S): Surgeon(s) and Role:    Harlin Heys, MD - Primary   ANESTHESIA: Choice  ESTIMATED BLOOD LOSS: 50 mL  SPECIMEN:  ID Type Source Tests Collected by Time Destination  1 : [employer] Tissue PATH Other SURGICAL PATHOLOGY Harlin Heys, MD [id number]     COMPLICATIONS: None  DRAINS: Foley to gravity  DISPOSITION: Stable to recovery room  DESCRIPTION OF PROCEDURE:      The patient was prepped and draped in the dorsal lithotomy position and placed under general anesthesia. Her cervix was grasped with a Jacob's tenaculum. Respecting the position and curvature of her cervix, it was dilated to accommodate a number 10 suction curette. The suction curette was placed within the endometrial cavity and a pressure greater than 65 mmHg was allowed to build. A systematic curettage was performed in all quadrants until no additional tissue was noted. The uterus became firm and globular. Pitocin was run in the IV. The tenaculum was removed from the cervix and hemostasis was noted. The weighted speculum was removed and the patient went to recovery room in stable condition.    Finis Bud, M.D. 05/14/2022 10:30 AM

## 2022-05-14 NOTE — Anesthesia Procedure Notes (Signed)
Procedure Name: MAC Date/Time: 05/14/2022 10:10 AM Performed by: Jerrye Noble, CRNA Pre-anesthesia Checklist: Patient identified, Emergency Drugs available, Suction available and Patient being monitored Patient Re-evaluated:Patient Re-evaluated prior to induction Oxygen Delivery Method: Circle system utilized Preoxygenation: Pre-oxygenation with 100% oxygen Induction Type: IV induction Ventilation: Mask ventilation without difficulty LMA: LMA inserted LMA Size: 4.0 Number of attempts: 1 Placement Confirmation: positive ETCO2 and breath sounds checked- equal and bilateral Tube secured with: Tape Dental Injury: Teeth and Oropharynx as per pre-operative assessment

## 2022-05-14 NOTE — Interval H&P Note (Signed)
History and Physical Interval Note:  05/14/2022 10:01 AM  Kim Mayo  has presented today for surgery, with the diagnosis of missed abortion.  The various methods of treatment have been discussed with the patient and family. After consideration of risks, benefits and other options for treatment, the patient has consented to  Procedure(s): DILATATION AND EVACUATION (N/A) as a surgical intervention.  The patient's history has been reviewed, patient examined, no change in status, stable for surgery.  I have reviewed the patient's chart and labs.  Questions were answered to the patient's satisfaction.     Brennan Bailey

## 2022-05-15 ENCOUNTER — Encounter: Payer: Self-pay | Admitting: Obstetrics and Gynecology

## 2022-05-15 LAB — SURGICAL PATHOLOGY

## 2022-05-15 LAB — ABO/RH: ABO/RH(D): O POS

## 2022-05-29 ENCOUNTER — Encounter: Payer: Medicaid Other | Admitting: Family Medicine

## 2022-06-05 ENCOUNTER — Encounter: Payer: Medicaid Other | Admitting: Family Medicine

## 2022-08-30 ENCOUNTER — Encounter: Payer: Self-pay | Admitting: Obstetrics and Gynecology

## 2022-08-30 ENCOUNTER — Ambulatory Visit (INDEPENDENT_AMBULATORY_CARE_PROVIDER_SITE_OTHER): Payer: Medicaid Other | Admitting: Obstetrics and Gynecology

## 2022-08-30 VITALS — BP 110/80 | Ht 65.0 in | Wt 183.0 lb

## 2022-08-30 DIAGNOSIS — Z30014 Encounter for initial prescription of intrauterine contraceptive device: Secondary | ICD-10-CM | POA: Diagnosis not present

## 2022-08-30 DIAGNOSIS — Z30430 Encounter for insertion of intrauterine contraceptive device: Secondary | ICD-10-CM

## 2022-08-30 LAB — POCT URINE PREGNANCY: Preg Test, Ur: NEGATIVE

## 2022-08-30 MED ORDER — LEVONORGESTREL 20 MCG/DAY IU IUD
1.0000 | INTRAUTERINE_SYSTEM | Freq: Once | INTRAUTERINE | Status: AC
  Administered 2022-08-30: 1 via INTRAUTERINE

## 2022-08-30 NOTE — Progress Notes (Signed)
Dortha Kern, MD   Chief Complaint  Patient presents with   Contraception    Not sure [facility] method    HPI:      Kim Mayo is a 46 y.o. [phone] whose LMP was Patient's last menstrual period was 08/20/2022 (exact date)., presents today for [facility] consult. Pt is s/p SAB with D&C 5/23. Would like BC. Menses were Q3-8 wks before conception, lasting 1-4 days, light flow, no BTB, no dysmen. Has had 2 periods since SAB, last one 08/20/22 lasting 2 days, light flow, no dysmen. She is sexually active, not using condoms. No hx of HTN, DVTs, migraines, tob use. Did IUD in past and would like to do again. Wants non daily method.  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   Gallstones    HEADACHE, TENSION 07/06/2009    Past Surgical History:  Procedure Laterality Date   APPENDECTOMY     BREAST ENHANCEMENT SURGERY     x2   BREAST SURGERY     CESAREAN SECTION  08/01/2004   second c/s 01/08/12   CHOLECYSTECTOMY N/A 08/07/2015   Procedure: LAPAROSCOPIC CHOLECYSTECTOMY WITH INTRAOPERATIVE CHOLANGIOGRAM;  Surgeon: Tiney Rouge III, MD;  Location: [facility] ORS;  Service: General;  Laterality: N/A;   DILATION AND EVACUATION N/A 05/14/2022   Procedure: DILATATION AND EVACUATION;  Surgeon: Linzie Collin, MD;  Location: [facility] ORS;  Service: Gynecology;  Laterality: N/A;   ESOPHAGOGASTRODUODENOSCOPY (EGD) WITH PROPOFOL N/A 09/15/2021   Procedure: ESOPHAGOGASTRODUODENOSCOPY (EGD) WITH PROPOFOL;  Surgeon: Midge Minium, MD;  Location: [facility] ENDOSCOPY;  Service: Endoscopy;  Laterality: N/A;    Family History  Problem Relation Age of Onset   Cancer Mother 47       brain   Heart disease Father    Hypertension Father    Thyroid disease Father    Leukemia Cousin    Leukemia Cousin     Social History    Socioeconomic History   Marital status: Single    Spouse name: Not on file   Number of children: 3   Years of education: 12   Highest education level: Not on file  Occupational History   Occupation: office administration  Tobacco Use   Smoking status: Never   Smokeless tobacco: Never  Vaping Use   Vaping Use: Never used  Substance and Sexual Activity   Alcohol use: Not Currently   Drug use: No   Sexual activity: Yes    Birth control/protection: None  Other Topics Concern   Not on file  Social History Narrative   Not on file   Social Determinants of Health   Financial Resource Strain: Not on file  Food Insecurity: Not on file  Transportation Needs: Not on file  Physical Activity: Not on file  Stress: Not on file  Social Connections: Not on file  Intimate Partner Violence: Not on file    Outpatient Medications Prior to Visit  Medication Sig Dispense Refill   ALPRAZolam (XANAX) 1 MG tablet Take 1 mg by mouth 3 (three) times daily as needed.     levonorgestrel (MIRENA) 20 MCG/DAY IUD 1 each by Intrauterine route once.     ondansetron (ZOFRAN) 4 MG tablet Take 4 mg by mouth every 8 (eight) hours as needed.     pantoprazole (PROTONIX) 20  MG tablet Take 20 mg by mouth 2 (two) times daily.     dicyclomine (BENTYL) 10 MG capsule Take 10-20 mg by mouth 4 (four) times daily as needed (abdominal pain).     FLUoxetine (PROZAC) 10 MG capsule Take 1 capsule (10 mg total) by mouth daily. 30 capsule 11   ibuprofen (ADVIL) 800 MG tablet Take 1 tablet (800 mg total) by mouth every 8 (eight) hours as needed. 30 tablet 0   metoCLOPramide (REGLAN) 10 MG tablet Take 1 tablet (10 mg total) by mouth 3 (three) times daily before meals. 90 tablet 2   PARoxetine (PAXIL) 10 MG tablet Take 10 mg by mouth at bedtime.     sucralfate (CARAFATE) 1 g tablet Take 1 g by mouth 4 (four) times daily as needed.     No facility-administered medications prior to visit.      ROS:  Review of Systems   Constitutional:  Negative for fever.  Gastrointestinal:  Negative for blood in stool, constipation, diarrhea, nausea and vomiting.  Genitourinary:  Negative for dyspareunia, dysuria, flank pain, frequency, hematuria, urgency, vaginal bleeding, vaginal discharge and vaginal pain.  Musculoskeletal:  Negative for back pain.  Skin:  Negative for rash.   BREAST: No symptoms   OBJECTIVE:   Vitals:  BP 110/80   Ht 5\' 5"  (1.651 m)   Wt 183 lb (83 kg)   LMP 08/20/2022 (Exact Date)   Breastfeeding No   BMI 30.45 kg/m   Physical Exam Vitals reviewed.  Constitutional:      Appearance: She is well-developed.  Pulmonary:     Effort: Pulmonary effort is normal.  Genitourinary:    General: Normal vulva.     Pubic Area: No rash.      Labia:        Right: No rash, tenderness or lesion.        Left: No rash, tenderness or lesion.      Vagina: Normal. No vaginal discharge, erythema or tenderness.     Cervix: Normal.  Musculoskeletal:        General: Normal range of motion.     Cervical back: Normal range of motion.  Skin:    General: Skin is warm and dry.  Neurological:     General: No focal deficit present.     Mental Status: She is alert and oriented to person, place, and time.  Psychiatric:        Mood and Affect: Mood normal.        Behavior: Behavior normal.        Thought Content: Thought content normal.        Judgment: Judgment normal.     Results: Results for orders placed or performed in visit on 08/30/22 (from the past 24 hour(s))  POCT urine pregnancy     Status: Normal   Collection Time: 08/30/22  4:21 PM  Result Value Ref Range   Preg Test, Ur Negative Negative   IUD Insertion Procedure Note Patient identified, informed consent performed, consent signed.   Discussed risks of irregular bleeding, cramping, infection, malpositioning or misplacement of the IUD outside the uterus which may require further procedure such as laparoscopy, risk of failure <1%. Time out  was performed.  Urine pregnancy test negative.  Speculum placed in the vagina.  Cervix visualized.  Cleaned with Betadine x 2.  Grasped anteriorly with a single tooth tenaculum.  Uterus sounded to 9.5 cm.   IUD placed per manufacturer's recommendations.  Strings trimmed to 3 cm. Tenaculum  was removed, good hemostasis noted.  Patient tolerated procedure well.   ASSESSMENT:  Encounter for initial prescription of intrauterine contraceptive device (IUD); BC options discussed. Pt would like another IUD. Neg UPT today, placed today. Will repeat UPT at 4 wk f/u appt since placed day 11 of cycle.   Encounter for IUD insertion - Plan: POCT urine pregnancy, levonorgestrel (MIRENA) 20 MCG/DAY IUD 1 each   Meds ordered this encounter  Medications   levonorgestrel (MIRENA) 20 MCG/DAY IUD 1 each     Plan:  Patient was given post-procedure instructions.  She was advised to have backup contraception for one week.   Call if you are having increasing pain, cramps or bleeding or if you have a fever greater than 100.4 degrees F., shaking chills, nausea or vomiting. Patient was also asked to check IUD strings periodically and follow up in 4 weeks for IUD check.  Return in about 4 weeks (around 09/27/2022) for IUD f/u/UPT.  Taijah Macrae B. Aimar Borghi, PA-C 08/30/2022 4:48 PM      Assessment/Plan: Encounter for initial prescription of intrauterine contraceptive device (IUD)  Encounter for IUD insertion - Plan: POCT urine pregnancy, levonorgestrel (MIRENA) 20 MCG/DAY IUD 1 each    Meds ordered this encounter  Medications   levonorgestrel (MIRENA) 20 MCG/DAY IUD 1 each      Return in about 4 weeks (around 09/27/2022) for IUD f/u/UPT.  Chee Kinslow B. Debborah Alonge, PA-C 08/30/2022 4:48 PM

## 2022-08-31 ENCOUNTER — Encounter: Payer: Self-pay | Admitting: Obstetrics and Gynecology

## 2022-09-04 ENCOUNTER — Encounter: Payer: Self-pay | Admitting: Obstetrics and Gynecology

## 2022-09-25 NOTE — Progress Notes (Deleted)
   No chief complaint on file.    History of Present Illness:  Kim Mayo is a 46 y.o. that had a Mirena IUD placed approximately 1 month ago for [facility]. Since that time, she denies dyspareunia, pelvic pain, non-menstrual bleeding, vaginal d/c, heavy bleeding.   Review of Systems  Physical Exam:  There were no vitals taken for this visit. There is no height or weight on file to calculate BMI.  Pelvic exam:  Two IUD strings {DESC; PRESENT/ABSENT:17923} seen coming from the cervical os. EGBUS, vaginal vault and cervix: within normal limits   Assessment:   No diagnosis found.  IUD strings present in proper location; pt doing well  Plan: F/u if any signs of infection or can no longer feel the strings.   Mirabelle Cyphers B. Holbert Caples, PA-C 09/25/2022 10:08 AM

## 2022-10-01 ENCOUNTER — Ambulatory Visit: Payer: Self-pay | Admitting: Obstetrics and Gynecology

## 2022-10-01 DIAGNOSIS — Z30431 Encounter for routine checking of intrauterine contraceptive device: Secondary | ICD-10-CM

## 2022-12-28 ENCOUNTER — Other Ambulatory Visit: Payer: Self-pay | Admitting: Obstetrics and Gynecology

## 2023-04-20 IMAGING — CR DG FOOT COMPLETE 3+V*L*
3 series · 3 of 3 positions shown · non-contrast
Comparison: None.

CLINICAL DATA: Left foot and ankle pain after twisting injury
walking down stairs this afternoon. Heard a pop.

EXAM:
LEFT FOOT - COMPLETE 3+ VIEW

[foot ap]
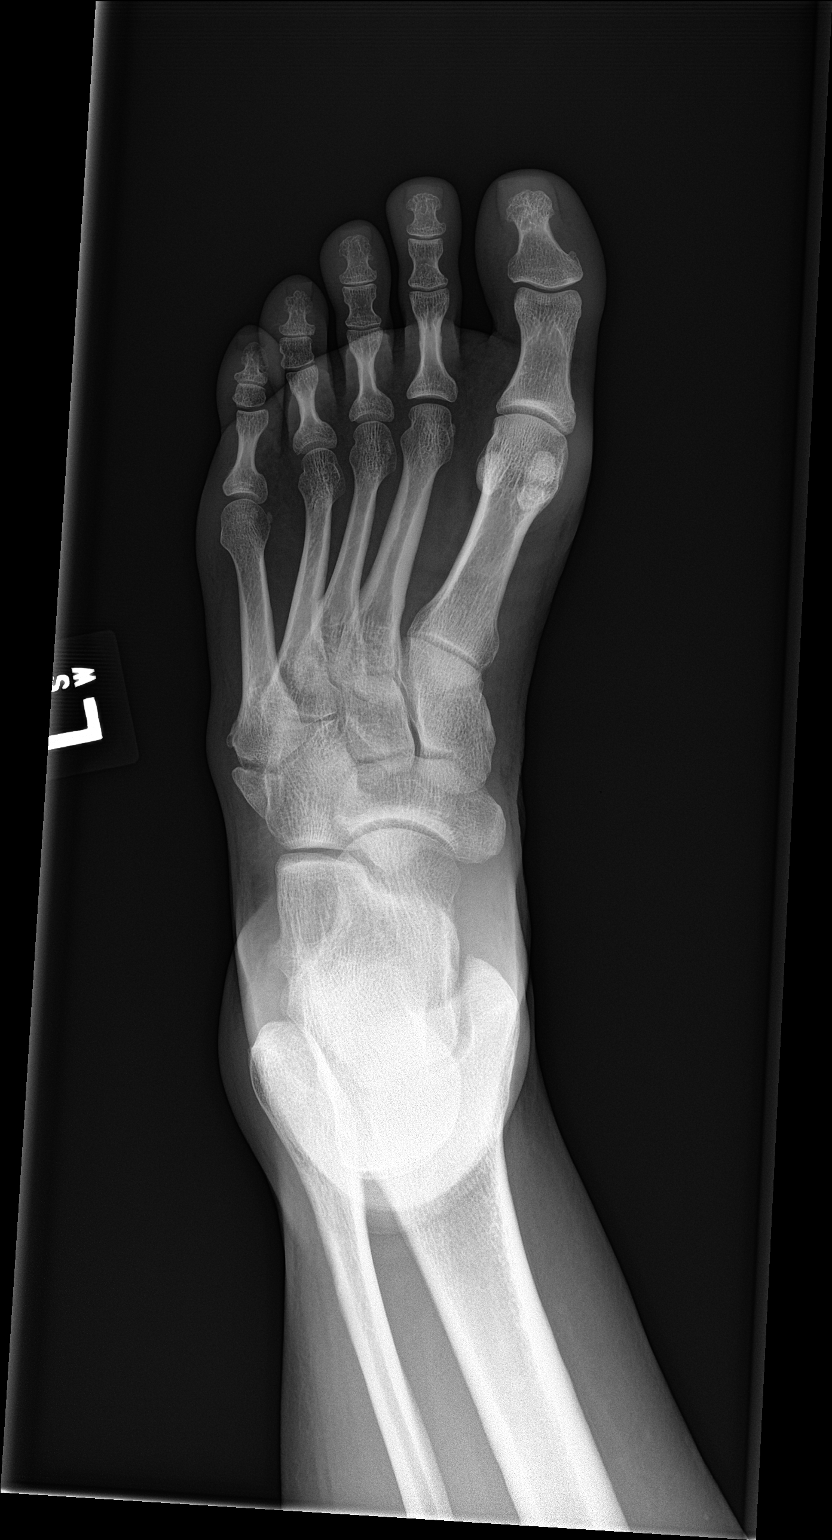

[foot obl]
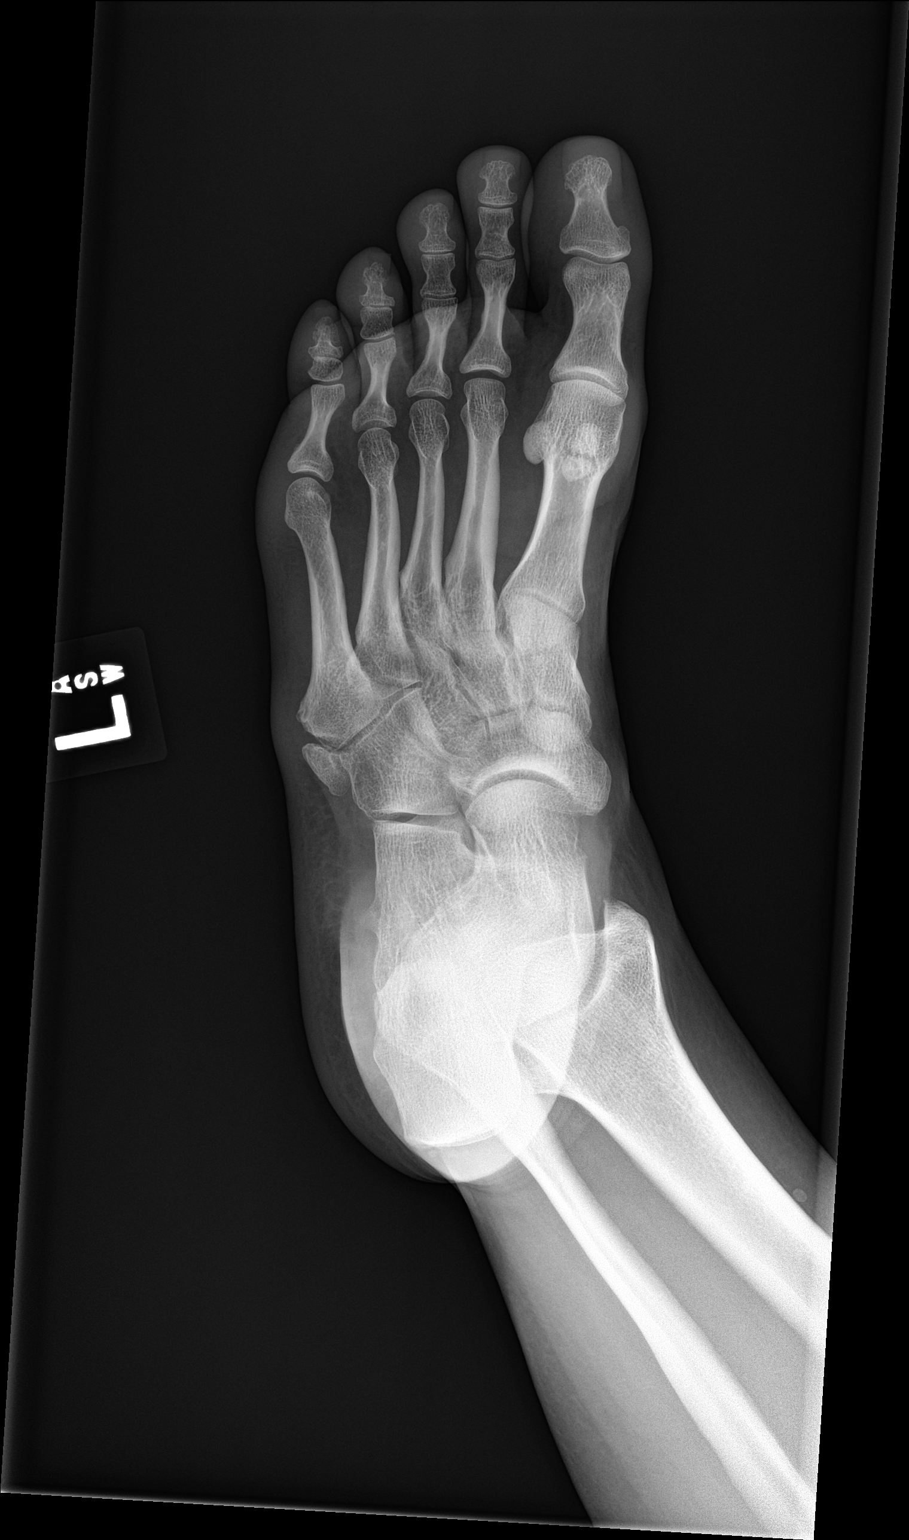

[foot lat]
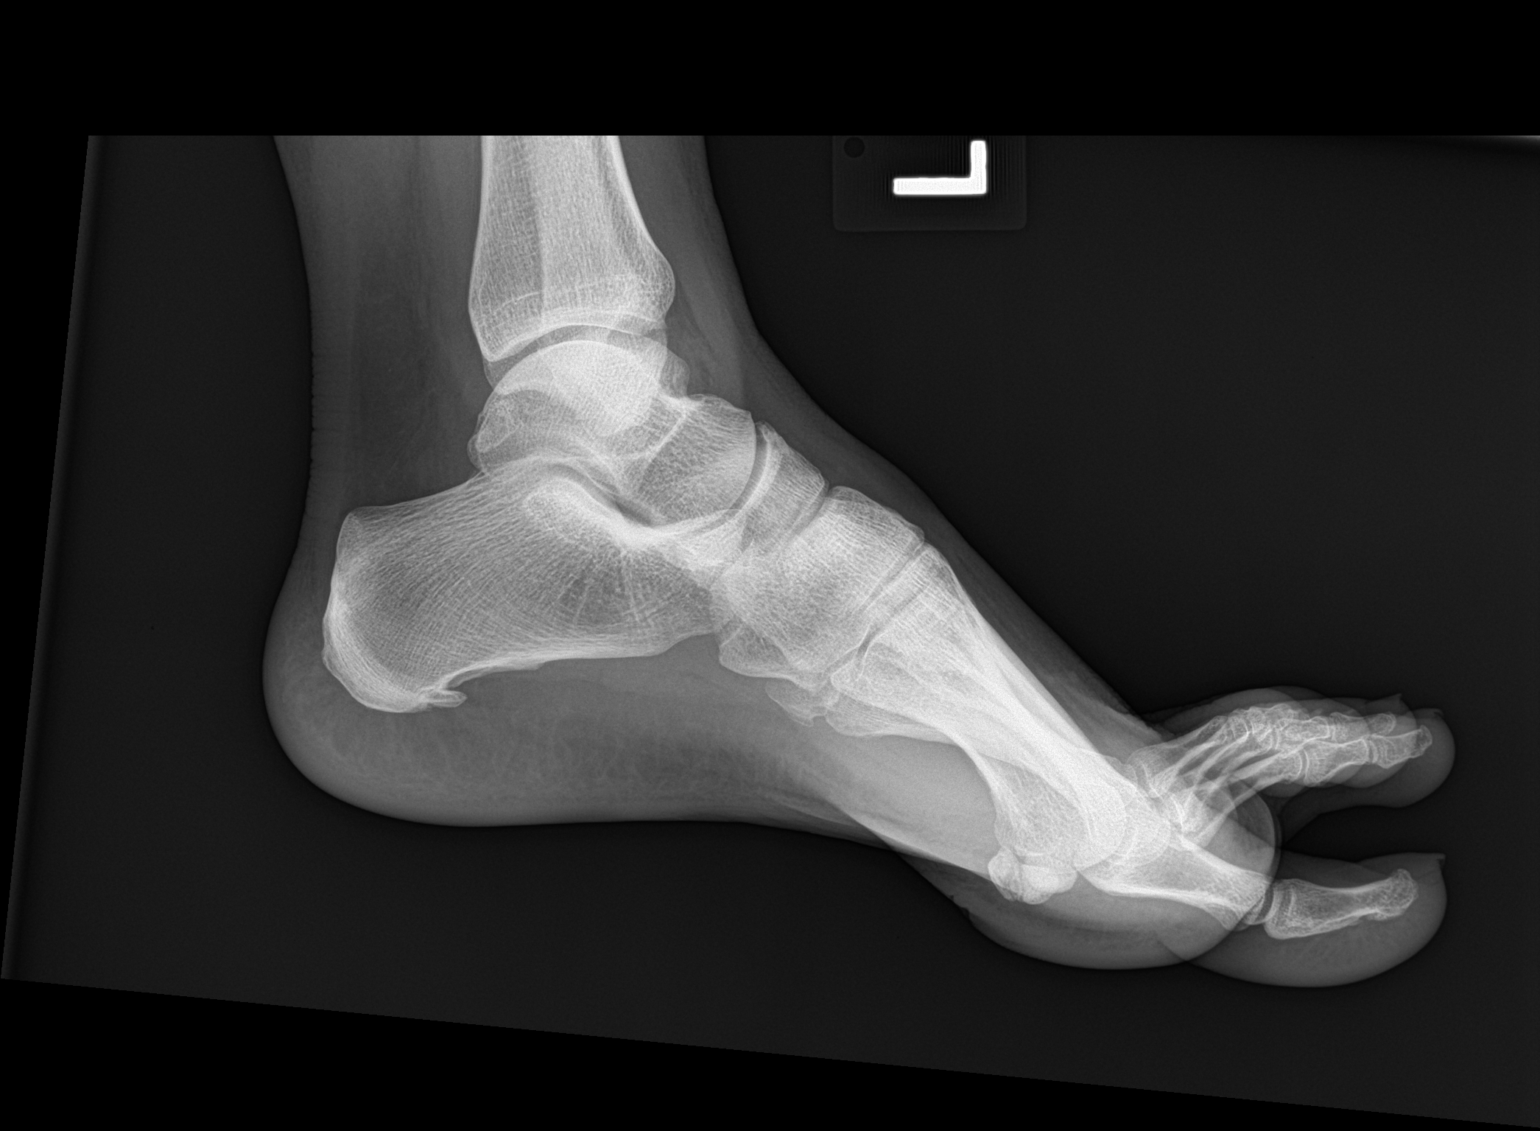

[3 of 3 positions shown; findings below may reference images not displayed]

FINDINGS: There is no evidence of fracture or dislocation. Well corticated
osseous density adjacent to the base of the fifth metatarsal may
represent sequela of remote injury or an accessory ossicle. Moderate
plantar calcaneal spur. Soft tissues are unremarkable.
IMPRESSION: 1. No fracture or subluxation of the left foot.
2. Moderate plantar calcaneal spur.

## 2023-04-20 IMAGING — CR DG ANKLE COMPLETE 3+V*L*
3 series · 3 of 3 positions shown · non-contrast
Comparison: None.

CLINICAL DATA: Left foot and ankle pain after twisting injury
walking down stairs this afternoon. Heard a popliteal

EXAM:
LEFT ANKLE COMPLETE - 3+ VIEW

[ankle ap]
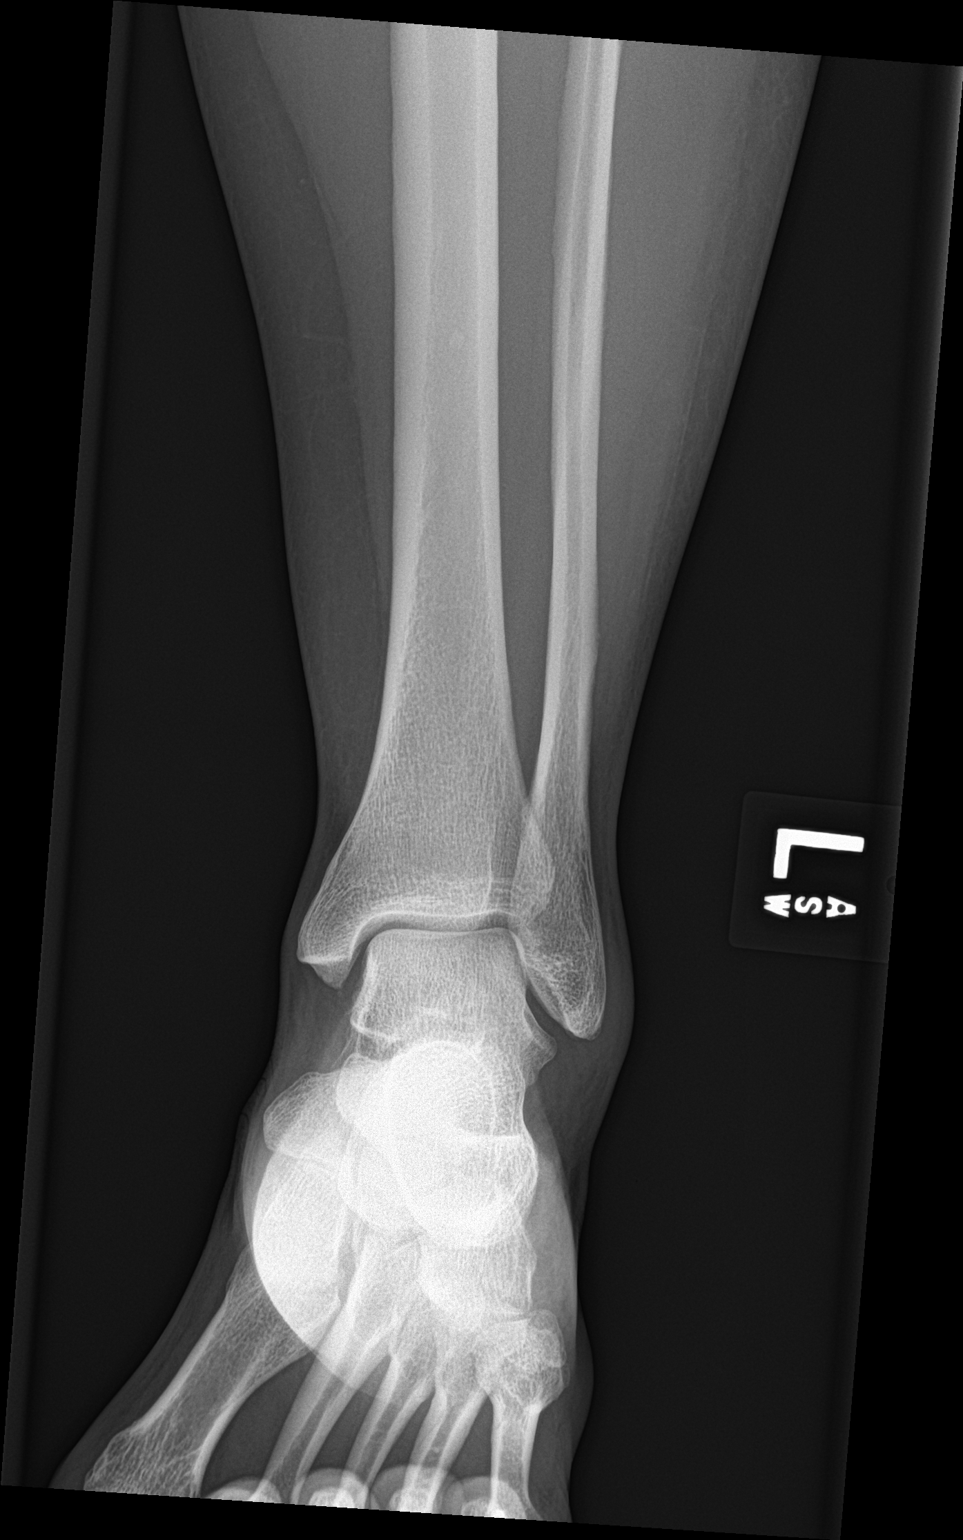

[ankle obl]
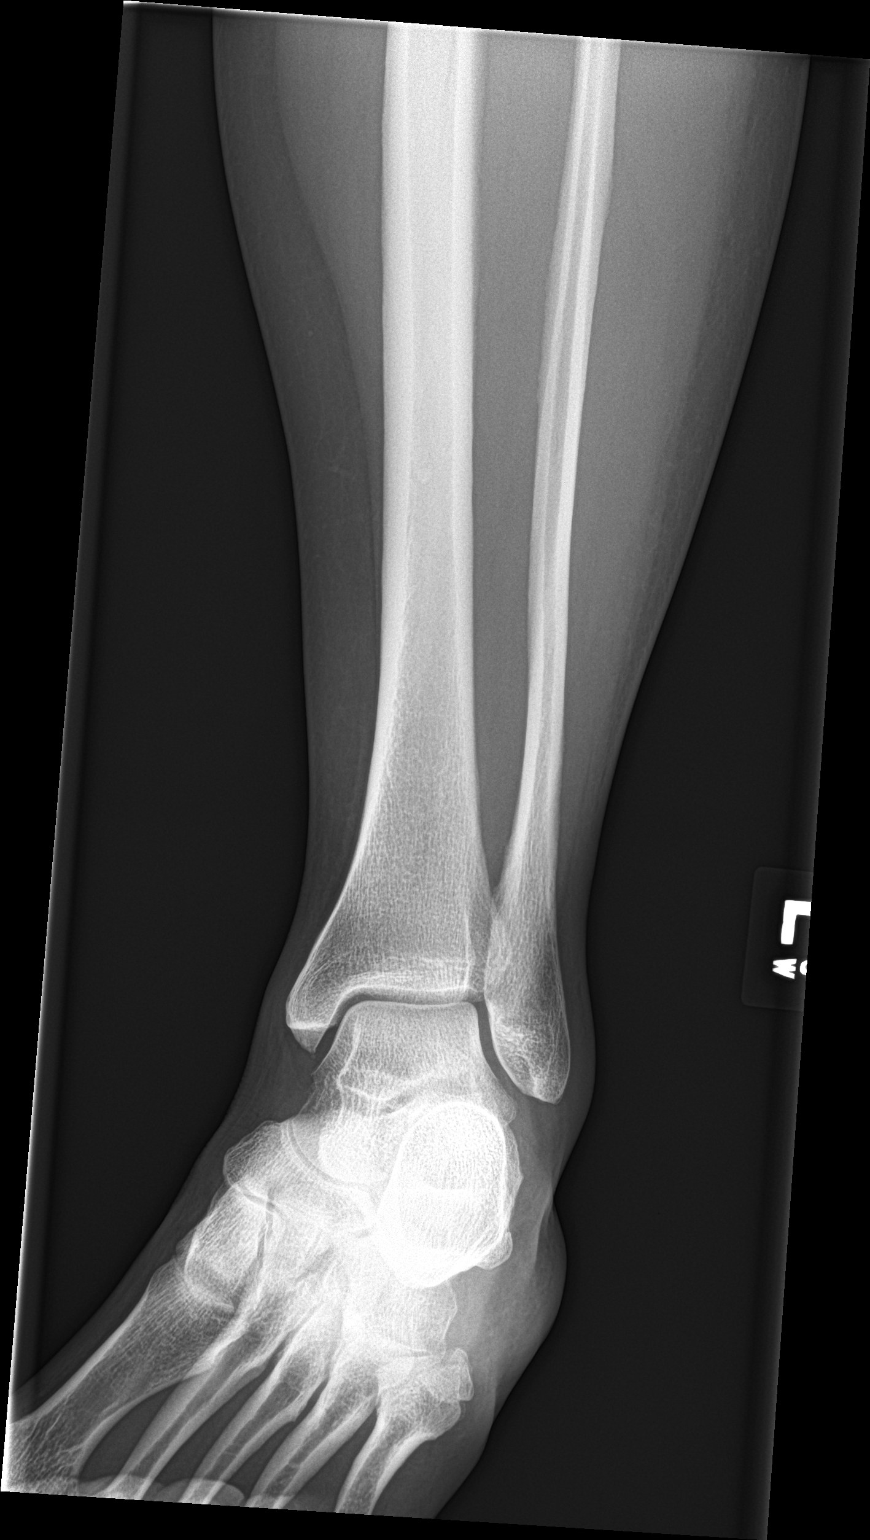

[ankle lat]
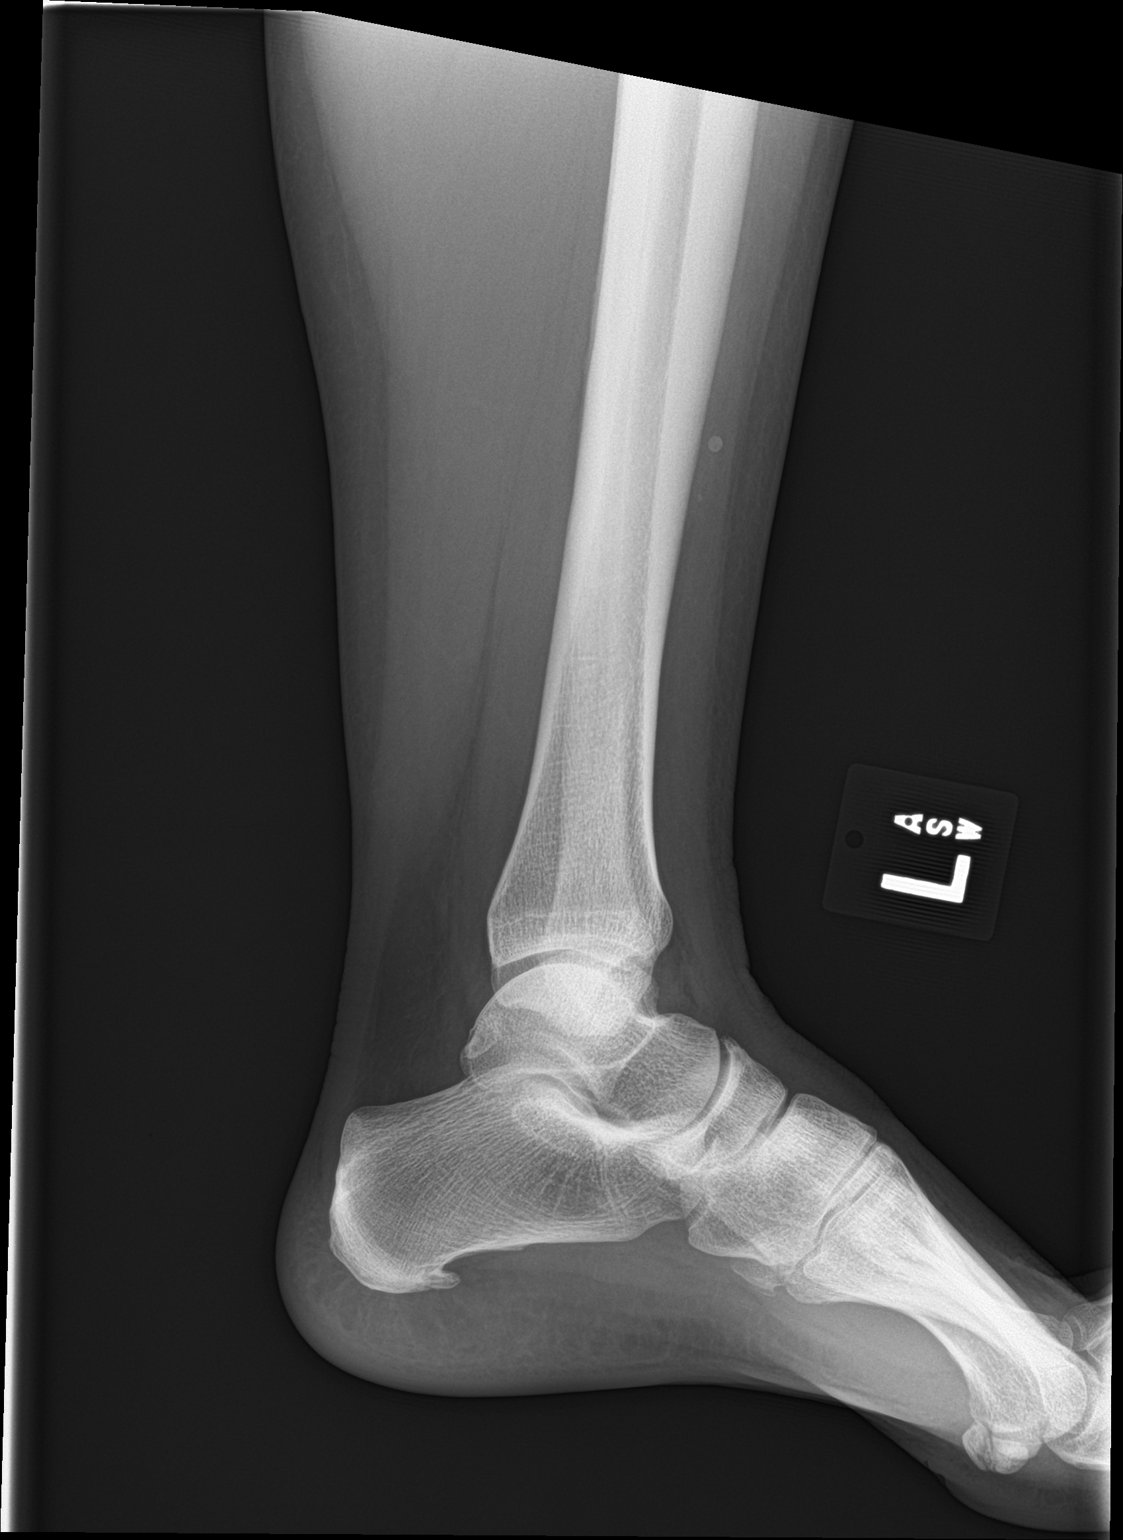

[3 of 3 positions shown; findings below may reference images not displayed]

FINDINGS: No acute fracture or dislocation. There is a small ankle joint
effusion. Minimal talonavicular spurring. Moderate plantar calcaneal
spur. Mild soft tissue edema anterolateral.
IMPRESSION: Soft tissue edema and small joint effusion. No acute fracture or
dislocation.
